# Patient Record
Sex: Male | Born: 1980 | Hispanic: Yes | Marital: Married | Smoking: Never smoker
Health system: Southern US, Community
[De-identification: ages and names within clinical notes are randomized; demographics above are authoritative.]

## PROBLEM LIST (undated history)

## (undated) DIAGNOSIS — K5790 Diverticulosis of intestine, part unspecified, without perforation or abscess without bleeding: Secondary | ICD-10-CM

## (undated) DIAGNOSIS — T7840XA Allergy, unspecified, initial encounter: Secondary | ICD-10-CM

## (undated) DIAGNOSIS — R7989 Other specified abnormal findings of blood chemistry: Secondary | ICD-10-CM

## (undated) HISTORY — PX: WISDOM TOOTH EXTRACTION: SHX21

## (undated) HISTORY — DX: Diverticulosis of intestine, part unspecified, without perforation or abscess without bleeding: K57.90

## (undated) HISTORY — DX: Other specified abnormal findings of blood chemistry: R79.89

## (undated) HISTORY — DX: Allergy, unspecified, initial encounter: T78.40XA

## (undated) HISTORY — PX: VASECTOMY: SHX75

---
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
## 2000-12-18 ENCOUNTER — Emergency Department (HOSPITAL_COMMUNITY): Admission: EM | Admit: 2000-12-18 | Discharge: 2000-12-18 | Payer: Self-pay | Admitting: Emergency Medicine

## 2001-12-14 ENCOUNTER — Emergency Department (HOSPITAL_COMMUNITY): Admission: EM | Admit: 2001-12-14 | Discharge: 2001-12-14 | Payer: Self-pay | Admitting: Emergency Medicine

## 2001-12-19 ENCOUNTER — Emergency Department (HOSPITAL_COMMUNITY): Admission: EM | Admit: 2001-12-19 | Discharge: 2001-12-19 | Payer: Self-pay | Admitting: Emergency Medicine

## 2011-05-25 ENCOUNTER — Ambulatory Visit (INDEPENDENT_AMBULATORY_CARE_PROVIDER_SITE_OTHER): Payer: BC Managed Care – PPO | Admitting: Family Medicine

## 2011-05-25 VITALS — BP 149/79 | HR 73 | Temp 98.4°F | Resp 16 | Ht 75.0 in | Wt 181.0 lb

## 2011-05-25 DIAGNOSIS — R5383 Other fatigue: Secondary | ICD-10-CM

## 2011-05-25 DIAGNOSIS — R5381 Other malaise: Secondary | ICD-10-CM

## 2011-05-25 LAB — COMPREHENSIVE METABOLIC PANEL
Albumin: 4.8 g/dL (ref 3.5–5.2)
CO2: 27 mEq/L (ref 19–32)
Calcium: 9.6 mg/dL (ref 8.4–10.5)
Chloride: 103 mEq/L (ref 96–112)
Glucose, Bld: 86 mg/dL (ref 70–99)
Potassium: 4.5 mEq/L (ref 3.5–5.3)
Sodium: 138 mEq/L (ref 135–145)
Total Bilirubin: 0.6 mg/dL (ref 0.3–1.2)
Total Protein: 7.4 g/dL (ref 6.0–8.3)

## 2011-05-25 LAB — CBC
Hemoglobin: 16.1 g/dL (ref 13.0–17.0)
MCH: 29.8 pg (ref 26.0–34.0)
RBC: 5.4 MIL/uL (ref 4.22–5.81)

## 2011-05-25 LAB — VITAMIN B12: Vitamin B-12: 865 pg/mL (ref 211–911)

## 2011-05-25 LAB — TSH: TSH: 1.604 u[IU]/mL (ref 0.350–4.500)

## 2011-05-25 NOTE — Progress Notes (Signed)
  Subjective:    Patient ID: Jesse Houston, male    DOB: 06-03-1980, 31 y.o.   MRN: [id number]  HPI 31 yo male with complaint of fatigue for 2 years.  Last few weeks seems worse - seems to have "mental fog".   3 years ago his 31 year old died of a brain tumor.  A year later his wife had a stillborn child (full term).  3 weeks ago his wife miscarried.  He feels this is related.  When his child was sick was up all day and night caring for him. Now endorses 7-8 hours of sleep a night.  Feels like he sleeps well but never feels fully rested.  Drowsy all day, no energy, does not enjoy doing things he used to enjoy.  Denies hopelessness, guilt, change of appetite.  Doesn't "feel depressed" but open to the possibility of that being the cause.   Does not fall asleep during the day but very tired all day.   Last labwork done here or in system January 2011 and normal except for borderline lipids.    Review of Systems Negative except as per HPI     Objective:   Physical Exam  Constitutional: He appears well-developed and well-nourished.  Cardiovascular: Normal rate, regular rhythm, normal heart sounds and intact distal pulses.   No murmur heard. Pulmonary/Chest: Effort normal and breath sounds normal.  Neurological: He is alert.  Skin: Skin is warm and dry.          Assessment & Plan:  Fatigue, lack of energy.  Suspect depression.  However will rule out organic causes by checking CBC, CMET, TSH, B12, Vit D, and testosterone.  If these are normal, patient open to trying antidepressant to see if it has any effect on symptoms.

## 2011-05-30 ENCOUNTER — Telehealth: Payer: Self-pay

## 2011-05-30 ENCOUNTER — Encounter: Payer: Self-pay | Admitting: Radiology

## 2011-05-30 NOTE — Telephone Encounter (Signed)
Yes, that is fine.  Just please find out the strength.  If he does not know it, okay to Rx Advair discus 100/5, 1 puff in  BID.  Number 1, refill x 3

## 2011-05-30 NOTE — Telephone Encounter (Signed)
Pt CB to get his lab results and gave current phone numbers. Explained lab results and called in Rx written by Dr Georgiana Shore in the lab notes to [facility] and [address]. Pt also requested a RF of his Advair diskus which he said we have Rxd in the past and he uses only during allergy season. I checked with his pharmacy and he doesn't have a Rx for inhaler that he has filled there. I have looked for pt's chart but could not find. I will ask [facility] to put it in your box if they can find it. Dr Georgiana Shore, do you want to send in a Rx for an Advair Diskus inhaler for pt? He did agree to RTC in 1 mos for re-check of testosterone, but is afraid he will need inhaler bf then d/t pollen season.

## 2011-06-03 MED ORDER — FLUTICASONE-SALMETEROL 250-50 MCG/DOSE IN AEPB: 1.0000 | INHALATION_SPRAY | Freq: Two times a day (BID) | RESPIRATORY_TRACT | Status: DC

## 2011-06-03 NOTE — Telephone Encounter (Signed)
Spoke with pt and he states he has been on the 250/50 inhaler. Sent in Advair 250/50 1 puff BID, #1 RF3. Pt also states that his pharmacy said his testosterone needs a PA and wonders if we can do this?

## 2011-06-04 NOTE — Telephone Encounter (Signed)
Called [employer] to get PA done and they said pt's coverage ended 05/16/11 and that he needs to call customer service to reactivate it. Notified pt and he will call them and/or talk with HR to find out who coverage is through, and he will CB.

## 2011-06-05 NOTE — Telephone Encounter (Signed)
LMOM that I received his message and completed it this morn, and that pt should check with his pharmacy tomorrow.

## 2011-06-05 NOTE — Telephone Encounter (Signed)
Pt states that he called [employer] and they stated that he is active and that they have no record of [place] contacting them. Pt would like for you to call back [employer] at [phone].

## 2011-08-02 ENCOUNTER — Ambulatory Visit (INDEPENDENT_AMBULATORY_CARE_PROVIDER_SITE_OTHER): Payer: BC Managed Care – PPO | Admitting: Internal Medicine

## 2011-08-02 VITALS — BP 100/64 | HR 56 | Temp 97.5°F | Resp 16 | Ht 75.0 in | Wt 178.0 lb

## 2011-08-02 DIAGNOSIS — R5381 Other malaise: Secondary | ICD-10-CM

## 2011-08-02 DIAGNOSIS — E291 Testicular hypofunction: Secondary | ICD-10-CM

## 2011-08-02 DIAGNOSIS — Z0000 Encounter for general adult medical examination without abnormal findings: Secondary | ICD-10-CM

## 2011-08-02 DIAGNOSIS — R5383 Other fatigue: Secondary | ICD-10-CM | POA: Insufficient documentation

## 2011-08-02 DIAGNOSIS — R7989 Other specified abnormal findings of blood chemistry: Secondary | ICD-10-CM | POA: Insufficient documentation

## 2011-08-02 LAB — POCT CBC
Granulocyte percent: 57 %G (ref 37–80)
Hemoglobin: 15 g/dL (ref 14.1–18.1)
MPV: 8.5 fL (ref 0–99.8)
POC Granulocyte: 4.8 (ref 2–6.9)
POC MID %: 8.5 %M (ref 0–12)
Platelet Count, POC: 300 10*3/uL (ref 142–424)
RBC: 4.14 M/uL — AB (ref 4.69–6.13)

## 2011-08-02 LAB — POCT URINALYSIS DIPSTICK
Bilirubin, UA: NEGATIVE
Glucose, UA: NEGATIVE
Leukocytes, UA: NEGATIVE
Nitrite, UA: NEGATIVE
Urobilinogen, UA: 0.2

## 2011-08-02 LAB — COMPREHENSIVE METABOLIC PANEL
ALT: 17 U/L (ref 0–53)
CO2: 27 mEq/L (ref 19–32)
Calcium: 9.2 mg/dL (ref 8.4–10.5)
Chloride: 103 mEq/L (ref 96–112)
Potassium: 4.2 mEq/L (ref 3.5–5.3)
Sodium: 139 mEq/L (ref 135–145)
Total Protein: 6.8 g/dL (ref 6.0–8.3)

## 2011-08-02 LAB — TSH: TSH: 1.134 u[IU]/mL (ref 0.350–4.500)

## 2011-08-02 LAB — PSA: PSA: 0.51 ng/mL (ref <=4.00)

## 2011-08-02 LAB — POCT UA - MICROSCOPIC ONLY: Casts, Ur, LPF, POC: NEGATIVE

## 2011-08-02 MED ORDER — FLUOXETINE HCL 20 MG PO CAPS: 20.0000 mg | ORAL_CAPSULE | Freq: Every day | ORAL | Status: DC

## 2011-08-02 NOTE — Patient Instructions (Signed)

## 2011-08-02 NOTE — Progress Notes (Signed)
  Subjective:    Patient ID: Jesse Houston, male    DOB: Oct 22, 1980, 31 y.o.   MRN: [id number]  HPI Hx of loss of child, and a stillborn child, and a misscarriage. Has fatigue and hx of low testosterone. Exercises and is fit. May be depressed, he agrees. Has good job.  See scanned hx See becks depression scale  Review of Systems See scanned ros    Objective:   Physical Exam  Constitutional: He appears well-developed and well-nourished. No distress.  HENT:  Right Ear: External ear normal.  Left Ear: External ear normal.  Nose: Nose normal.  Mouth/Throat: Oropharynx is clear and moist.  Eyes: Conjunctivae and EOM are normal. Pupils are equal, round, and reactive to light.  Neck: No thyromegaly present.  Cardiovascular: Normal rate, regular rhythm and normal heart sounds.   Pulmonary/Chest: Effort normal and breath sounds normal.  Abdominal: Soft. Bowel sounds are normal. He exhibits no mass.  Genitourinary: Penis normal. No penile tenderness.  Musculoskeletal: Normal range of motion. He exhibits no tenderness.  Lymphadenopathy:    He has no cervical adenopathy.  Neurological: He is alert. He has normal reflexes. Coordination normal.  Skin: Skin is warm and dry.  Psychiatric: He has a normal mood and affect. His behavior is normal. Thought content normal.   Labs done       Assessment & Plan:  Do early am total and free testosterone--- ordered

## 2011-08-03 ENCOUNTER — Encounter: Payer: Self-pay | Admitting: Radiology

## 2011-08-03 ENCOUNTER — Other Ambulatory Visit (INDEPENDENT_AMBULATORY_CARE_PROVIDER_SITE_OTHER): Payer: BC Managed Care – PPO | Admitting: Physician Assistant

## 2011-08-03 VITALS — BP 107/69 | HR 58 | Temp 98.3°F | Resp 16 | Ht 75.38 in | Wt 180.4 lb

## 2011-08-03 DIAGNOSIS — E236 Other disorders of pituitary gland: Secondary | ICD-10-CM

## 2011-08-03 DIAGNOSIS — R5381 Other malaise: Secondary | ICD-10-CM

## 2011-08-03 DIAGNOSIS — R7989 Other specified abnormal findings of blood chemistry: Secondary | ICD-10-CM

## 2011-08-03 NOTE — Progress Notes (Signed)
Patient here for total testosterone and free testosterone labs only. Order written on Rx by Dr. Perrin Maltese. Will forward results to Dr. Perrin Maltese.

## 2011-08-05 LAB — TESTOSTERONE, FREE, TOTAL, SHBG
Testosterone, Free: 76.3 pg/mL (ref 47.0–244.0)
Testosterone-% Free: 2.5 % (ref 1.6–2.9)
Testosterone: 299.76 ng/dL — ABNORMAL LOW (ref 300–890)

## 2012-07-31 ENCOUNTER — Ambulatory Visit (INDEPENDENT_AMBULATORY_CARE_PROVIDER_SITE_OTHER): Payer: BC Managed Care – PPO | Admitting: Family Medicine

## 2012-07-31 VITALS — BP 100/78 | HR 60 | Temp 98.0°F | Resp 16 | Ht 75.0 in | Wt 180.0 lb

## 2012-07-31 DIAGNOSIS — Z0000 Encounter for general adult medical examination without abnormal findings: Secondary | ICD-10-CM

## 2012-07-31 LAB — COMPREHENSIVE METABOLIC PANEL
BUN: 19 mg/dL (ref 6–23)
CO2: 28 mEq/L (ref 19–32)
Calcium: 9.5 mg/dL (ref 8.4–10.5)
Chloride: 103 mEq/L (ref 96–112)
Creat: 0.92 mg/dL (ref 0.50–1.35)

## 2012-07-31 LAB — POCT URINALYSIS DIPSTICK
Blood, UA: NEGATIVE
Ketones, UA: NEGATIVE
Protein, UA: NEGATIVE
Spec Grav, UA: 1.025
pH, UA: 6.5

## 2012-07-31 LAB — POCT CBC
Granulocyte percent: 64.5 %G (ref 37–80)
HCT, POC: 49.7 % (ref 43.5–53.7)
MCV: 91.8 fL (ref 80–97)
MID (cbc): 0.7 (ref 0–0.9)
POC Granulocyte: 5.3 (ref 2–6.9)
Platelet Count, POC: 373 10*3/uL (ref 142–424)
RBC: 5.41 M/uL (ref 4.69–6.13)

## 2012-07-31 LAB — TSH: TSH: 1.188 u[IU]/mL (ref 0.350–4.500)

## 2012-07-31 LAB — LIPID PANEL
HDL: 40 mg/dL (ref >39)
Triglycerides: 160 mg/dL — ABNORMAL HIGH (ref <150)

## 2012-07-31 NOTE — Patient Instructions (Addendum)
If problems arise return.

## 2012-07-31 NOTE — Progress Notes (Signed)
Annual physical examination:  History: Patient is here for physical examination. He just decided it was time he get one. He doesn't have any feels like he should have. He also has a sex drive he would like to have. He would like to have his testosterone checked. He has some progressive urination I Jesse Houston been no other major complaints.  Past medical history: General he is been healthy. Medications: Multivitamin Allergies: None Hospitalizations: None Operations: None  Family history: Both parents are living and well. One sibling living and well. No major familial diseases. The patient and his wife lost a 65-year-old son to a brain tumor a few years ago. The same year they had had a full-term stillborn.  Social history: Happily married to the same woman for 10 years. Has been together with her for 13 years. The above history on their son who died. Also had a miscarriage during the past year. Having children is on hold for now. Does not smoke, drinks on weekends, does not use any drugs. His job is Marketing executive business. He is currently working out the arm and travels one hour back and forth each day.  Review of systems: Constitutional: Doesn't have energy he feels like he should have. He does try to take care of himself. HEENT: Unremarkable Respiratory: Unremarkable Cardiovascular: Unremarkable Genitourinary: Unremarkable Gastrointestinal: Unremarkable Exoskeletal: Unremarkable Neurologic: Unremarkable Psychiatric: Unremarkable Dermatologic: Unremarkable Endocrinologic: Unremarkable  Physical examination Healthy-appearing male in no major acute distress. HEENT: Eyes PERRLA. Fundi benign. TMs normal. Throat clear. Neck supple without nodes thyromegaly. No carotid bruits. Chest clear to auscultation. Heart regular without murmurs gallops or arrhythmias. Abdomen soft without mass or tenderness. No lower extremity genitalia testes descended. No hernias. Testes are normal in size.  Extremities unremarkable. Spine normal. Skin normal. Deep tender reflexes symmetrical.  Assessment: Normal complete physical examination Fatigue  Plan: Screening labs  Initial labs including A1c, CBC, and urine are normal.

## 2012-08-03 ENCOUNTER — Encounter: Payer: Self-pay | Admitting: *Deleted

## 2012-11-27 ENCOUNTER — Ambulatory Visit (INDEPENDENT_AMBULATORY_CARE_PROVIDER_SITE_OTHER): Payer: BC Managed Care – PPO | Admitting: Family Medicine

## 2012-11-27 VITALS — BP 90/70 | HR 86 | Temp 98.5°F | Resp 16 | Ht 75.0 in | Wt 174.0 lb

## 2012-11-27 DIAGNOSIS — J309 Allergic rhinitis, unspecified: Secondary | ICD-10-CM

## 2012-11-27 DIAGNOSIS — J302 Other seasonal allergic rhinitis: Secondary | ICD-10-CM

## 2012-11-27 MED ORDER — FEXOFENADINE HCL 180 MG PO TABS
180.0000 mg | ORAL_TABLET | Freq: Every day | ORAL | Status: DC
Start: 2012-11-27 — End: 2013-07-05

## 2012-11-27 MED ORDER — FLUTICASONE PROPIONATE 50 MCG/ACT NA SUSP: 2.0000 | Freq: Every day | NASAL | Status: DC

## 2012-11-27 NOTE — Progress Notes (Signed)
 [facility]:  Office Visit  Chief Complaint:  Chief Complaint  Patient presents with  . Allergies    x 3 mths    HPI: Jesse Houston is a 32 y.o. male who complains of  Allergy sxs x 3 months, last week started on zyrtec and helps for 3-4 hrs. He just has congestion. He uses Advair diskus  for his asthma. He has been takinghis meds regular. He has stuffiness in his nose. He denies SOB, wheezing , tobacco use. Denies fevers, chills, sinus pressure  Past Medical History  Diagnosis Date  . Allergy   . Asthma   . Low testosterone    History reviewed. No pertinent past surgical history. History   Social History  . Marital Status: Married    Spouse Name: N/A    Number of Children: N/A  . Years of Education: N/A   Social History Main Topics  . Smoking status: Never Smoker   . Smokeless tobacco: None  . Alcohol Use: 1 - 1.5 oz/week    2-3 drink(s) per week  . Drug Use: No  . Sexual Activity: Yes    Birth Control/ Protection: Condom   Other Topics Concern  . None   Social History Narrative  . None   Family History  Problem Relation Age of Onset  . Arthritis Mother   . Brain cancer Son   . Arthritis Maternal Grandmother   . Diabetes Maternal Grandfather   . Uterine cancer Paternal Grandmother   . Cirrhosis Paternal Grandfather    No Known Allergies Prior to Admission medications   Medication Sig Start Date End Date Taking? Authorizing Provider  Multiple Vitamin (MULTIVITAMIN) tablet Take 1 tablet by mouth daily.   Yes Historical Provider, MD     ROS: The patient denies fevers, chills, night sweats, unintentional weight loss, chest pain, palpitations, wheezing, dyspnea on exertion, nausea, vomiting, abdominal pain, dysuria, hematuria, melena, numbness, weakness, or tingling.   All other systems have been reviewed and were otherwise negative with the exception of those mentioned in the HPI and as above.    PHYSICAL EXAM: Filed Vitals:   11/27/12 0819  BP: 90/70  Pulse: 86  Temp: 98.5 F (36.9 C)  Resp: 16   Filed Vitals:   11/27/12 0819  Height: 6\' 3"  (1.905 m)  Weight: 174 lb (78.926 kg)   Body mass index is 21.75 kg/(m^2).  General: Alert, no acute distress HEENT:  Normocephalic, atraumatic, oropharynx patent. EOMI, PERRLA. + boggy nares, erythematous.  No sinus tenderness, Tm nl Cardiovascular:  Regular rate and rhythm, no rubs murmurs or gallops.  No Carotid bruits, radial pulse intact. No pedal edema.  Respiratory: Clear to auscultation bilaterally.  No wheezes, rales, or rhonchi.  No cyanosis, no use of accessory musculature GI: No organomegaly, abdomen is soft and non-tender, positive bowel sounds.  No masses. Skin: No rashes. Neurologic: Facial musculature symmetric. Psychiatric: Patient is appropriate throughout our interaction. Lymphatic: No cervical lymphadenopathy Musculoskeletal: Gait intact.   LABS: Results for orders placed in visit on 07/31/12  COMPREHENSIVE METABOLIC PANEL      Result Value Range   Sodium 140  135 - 145 mEq/L   Potassium 4.2  3.5 - 5.3 mEq/L   Chloride 103  96 - 112 mEq/L   CO2 28  19 - 32 mEq/L   Glucose, Bld 86  70 - 99 mg/dL   BUN 19  6 - 23 mg/dL   Creat 1.61  0.96 - 0.45 mg/dL  Total Bilirubin 0.9  0.3 - 1.2 mg/dL   Alkaline Phosphatase 88  39 - 117 U/L   AST 19  0 - 37 U/L   ALT 16  0 - 53 U/L   Total Protein 7.0  6.0 - 8.3 g/dL   Albumin 4.8  3.5 - 5.2 g/dL   Calcium 9.5  8.4 - 16.1 mg/dL  TSH      Result Value Range   TSH 1.188  0.350 - 4.500 uIU/mL  LIPID PANEL      Result Value Range   Cholesterol 194  0 - 200 mg/dL   Triglycerides 096 (*) <150 mg/dL   HDL 40  >04 mg/dL   Total CHOL/HDL Ratio 4.9     VLDL 32  0 - 40 mg/dL   LDL Cholesterol 540 (*) 0 - 99 mg/dL  POCT CBC      Result Value Range   WBC 8.2  4.6 - 10.2 K/uL   Lymph, poc 2.2  0.6 - 3.4   POC LYMPH PERCENT 27.4  10 - 50 %L   MID (cbc) 0.7  0 - 0.9   POC MID % 8.1  0 - 12 %M   POC  Granulocyte 5.3  2 - 6.9   Granulocyte percent 64.5  37 - 80 %G   RBC 5.41  4.69 - 6.13 M/uL   Hemoglobin 16.6  14.1 - 18.1 g/dL   HCT, POC 98.1  19.1 - 53.7 %   MCV 91.8  80 - 97 fL   MCH, POC 30.7  27 - 31.2 pg   MCHC 33.4  31.8 - 35.4 g/dL   RDW, POC 47.8     Platelet Count, POC 373  142 - 424 K/uL   MPV 8.7  0 - 99.8 fL  POCT URINALYSIS DIPSTICK      Result Value Range   Color, UA yellow     Clarity, UA clear     Glucose, UA neg     Bilirubin, UA neg     Ketones, UA neg     Spec Grav, UA 1.025     Blood, UA neg     pH, UA 6.5     Protein, UA neg     Urobilinogen, UA 0.2     Nitrite, UA neg     Leukocytes, UA Negative    POCT GLYCOSYLATED HEMOGLOBIN (HGB A1C)      Result Value Range   Hemoglobin A1C 4.8       EKG/XRAY:   Primary read interpreted by Dr. Conley Rolls at [facility].   ASSESSMENT/PLAN: Encounter Diagnoses  Name Primary?  . Seasonal allergies Yes  . Allergic rhinitis    Rx Flonase  Rx Allegra May take Zyrtec in AM F/u prn Gross sideeffects, risk and benefits, and alternatives of medications d/w patient. Patient is aware that all medications have potential sideeffects and we are unable to predict every sideeffect or drug-drug interaction that may occur.  ,  PHUONG, DO 11/27/2012 8:48 AM

## 2012-11-27 NOTE — Patient Instructions (Signed)
Allergies, Generic  Allergies may happen from anything your body is sensitive to. This may be food, medicines, pollens, chemicals, and nearly anything around you in everyday life that produces allergens. An allergen is anything that causes an allergy producing substance. Heredity is often a factor in causing these problems. This means you may have some of the same allergies as your parents.  Food allergies happen in all age groups. Food allergies are some of the most severe and life threatening. Some common food allergies are cow's milk, seafood, eggs, nuts, wheat, and soybeans.  SYMPTOMS    Swelling around the mouth.   An itchy red rash or hives.   Vomiting or diarrhea.   Difficulty breathing.  SEVERE ALLERGIC REACTIONS ARE LIFE-THREATENING.  This reaction is called anaphylaxis. It can cause the mouth and throat to swell and cause difficulty with breathing and swallowing. In severe reactions only a trace amount of food (for example, peanut oil in a salad) may cause death within seconds.  Seasonal allergies occur in all age groups. These are seasonal because they usually occur during the same season every year. They may be a reaction to molds, grass pollens, or tree pollens. Other causes of problems are house dust mite allergens, pet dander, and mold spores. The symptoms often consist of nasal congestion, a runny itchy nose associated with sneezing, and tearing itchy eyes. There is often an associated itching of the mouth and ears. The problems happen when you come in contact with pollens and other allergens. Allergens are the particles in the air that the body reacts to with an allergic reaction. This causes you to release allergic antibodies. Through a chain of events, these eventually cause you to release histamine into the blood stream. Although it is meant to be protective to the body, it is this release that causes your discomfort. This is why you were given anti-histamines to feel better. If you are  unable to pinpoint the offending allergen, it may be determined by skin or blood testing. Allergies cannot be cured but can be controlled with medicine.  Hay fever is a collection of all or some of the seasonal allergy problems. It may often be treated with simple over-the-counter medicine such as diphenhydramine. Take medicine as directed. Do not drink alcohol or drive while taking this medicine. Check with your caregiver or package insert for child dosages.  If these medicines are not effective, there are many new medicines your caregiver can prescribe. Stronger medicine such as nasal spray, eye drops, and corticosteroids may be used if the first things you try do not work well. Other treatments such as immunotherapy or desensitizing injections can be used if all else fails. Follow up with your caregiver if problems continue. These seasonal allergies are usually not life threatening. They are generally more of a nuisance that can often be handled using medicine.  HOME CARE INSTRUCTIONS    If unsure what causes a reaction, keep a diary of foods eaten and symptoms that follow. Avoid foods that cause reactions.   If hives or rash are present:   Take medicine as directed.   You may use an over-the-counter antihistamine (diphenhydramine) for hives and itching as needed.   Apply cold compresses (cloths) to the skin or take baths in cool water. Avoid hot baths or showers. Heat will make a rash and itching worse.   If you are severely allergic:   Following a treatment for a severe reaction, hospitalization is often required for closer follow-up.     Wear a medic-alert bracelet or necklace stating the allergy.   You and your family must learn how to give adrenaline or use an anaphylaxis kit.   If you have had a severe reaction, always carry your anaphylaxis kit or EpiPen with you. Use this medicine as directed by your caregiver if a severe reaction is occurring. Failure to do so could have a fatal outcome.  SEEK  MEDICAL CARE IF:   You suspect a food allergy. Symptoms generally happen within 30 minutes of eating a food.   Your symptoms have not gone away within 2 days or are getting worse.   You develop new symptoms.   You want to retest yourself or your child with a food or drink you think causes an allergic reaction. Never do this if an anaphylactic reaction to that food or drink has happened before. Only do this under the care of a caregiver.  SEEK IMMEDIATE MEDICAL CARE IF:    You have difficulty breathing, are wheezing, or have a tight feeling in your chest or throat.   You have a swollen mouth, or you have hives, swelling, or itching all over your body.   You have had a severe reaction that has responded to your anaphylaxis kit or an EpiPen. These reactions may return when the medicine has worn off. These reactions should be considered life threatening.  MAKE SURE YOU:    Understand these instructions.   Will watch your condition.   Will get help right away if you are not doing well or get worse.  Document Released: 05/28/2002 Document Revised: 05/27/2011 Document Reviewed: 11/02/2007  [employer] Patient Information 2014 [employer].

## 2013-07-05 ENCOUNTER — Ambulatory Visit (INDEPENDENT_AMBULATORY_CARE_PROVIDER_SITE_OTHER): Payer: BC Managed Care – PPO | Admitting: Physician Assistant

## 2013-07-05 VITALS — BP 130/80 | HR 86 | Temp 98.0°F | Resp 16 | Ht 74.0 in | Wt 185.0 lb

## 2013-07-05 DIAGNOSIS — J309 Allergic rhinitis, unspecified: Secondary | ICD-10-CM

## 2013-07-05 DIAGNOSIS — R0602 Shortness of breath: Secondary | ICD-10-CM

## 2013-07-05 DIAGNOSIS — J302 Other seasonal allergic rhinitis: Secondary | ICD-10-CM

## 2013-07-05 MED ORDER — FLUTICASONE PROPIONATE 50 MCG/ACT NA SUSP: 2.0000 | Freq: Every day | NASAL | Status: DC

## 2013-07-05 MED ORDER — ALBUTEROL SULFATE HFA 108 (90 BASE) MCG/ACT IN AERS: 2.0000 | INHALATION_SPRAY | RESPIRATORY_TRACT | Status: DC | PRN

## 2013-07-05 MED ORDER — FLUTICASONE-SALMETEROL 115-21 MCG/ACT IN AERO: 2.0000 | INHALATION_SPRAY | Freq: Two times a day (BID) | RESPIRATORY_TRACT | Status: DC

## 2013-07-05 MED ORDER — FEXOFENADINE HCL 180 MG PO TABS: 180.0000 mg | ORAL_TABLET | Freq: Every day | ORAL | Status: DC

## 2013-07-05 NOTE — Progress Notes (Signed)
   Subjective:    Patient ID: Jesse Houston, male    DOB: 02/12/1981, 33 y.o.   MRN: [id number]  HPI 33 year old male presents for refills of his Advair inhaler and allergy meds.  He typically uses these in the spring due to seasonal allergies. Admits he does well the rest of the year.  2 days ago he developed SOB and chest tightness that worsened last night.  He has been using Flonase and Allegra which does keep his AR symptoms controlled.  No dx of asthma. Admits he had an albuterol inhaler that he used as a rescue but thinks he was told to dc it at some point. Does not know why - says it worked well for his symptoms.  Denies chest pain, wheezing, nasal congestion, PND, dizziness, headache, or cough.  Patient is otherwise doing well with no other concerns today.     Review of Systems  Constitutional: Negative for fever and chills.  HENT: Negative for congestion, postnasal drip and sore throat.   Respiratory: Positive for chest tightness and shortness of breath. Negative for cough and wheezing.   Cardiovascular: Negative for chest pain.  Neurological: Negative for dizziness and headaches.       Objective:   Physical Exam  Constitutional: He is oriented to person, place, and time. He appears well-developed and well-nourished.  HENT:  Head: Normocephalic and atraumatic.  Right Ear: External ear normal.  Left Ear: External ear normal.  Eyes: Conjunctivae are normal.  Neck: Normal range of motion. Neck supple.  Cardiovascular: Normal rate, regular rhythm and normal heart sounds.   Pulmonary/Chest: Effort normal and breath sounds normal.  Neurological: He is alert and oriented to person, place, and time.  Psychiatric: He has a normal mood and affect. His behavior is normal. Judgment and thought content normal.          Assessment & Plan:  Seasonal allergies - Plan: fexofenadine (ALLEGRA) 180 MG tablet, fluticasone (FLONASE) 50 MCG/ACT nasal spray, fluticasone-salmeterol (ADVAIR HFA)  115-21 MCG/ACT inhaler, albuterol (PROVENTIL HFA;VENTOLIN HFA) 108 (90 BASE) MCG/ACT inhaler  Allergic rhinitis - Plan: fexofenadine (ALLEGRA) 180 MG tablet, fluticasone (FLONASE) 50 MCG/ACT nasal spray  SOB (shortness of breath) - Plan: fluticasone-salmeterol (ADVAIR HFA) 115-21 MCG/ACT inhaler, albuterol (PROVENTIL HFA;VENTOLIN HFA) 108 (90 BASE) MCG/ACT inhaler  Refilled all medications and ok to refill if needed x 1 year.  Continue Allegra and Flonase daily as directed Advair twice daily and albuterol q4-6 hours prn SOB, chest tightness RTC precautions discussed. Follow up if symptoms worsening or fail to improve

## 2014-07-08 ENCOUNTER — Ambulatory Visit (INDEPENDENT_AMBULATORY_CARE_PROVIDER_SITE_OTHER): Payer: BLUE CROSS/BLUE SHIELD | Admitting: Family Medicine

## 2014-07-08 ENCOUNTER — Encounter: Payer: Self-pay | Admitting: Family Medicine

## 2014-07-08 VITALS — BP 121/81 | HR 71 | Temp 98.2°F | Resp 16 | Ht 75.0 in | Wt 162.6 lb

## 2014-07-08 DIAGNOSIS — Z131 Encounter for screening for diabetes mellitus: Secondary | ICD-10-CM | POA: Diagnosis not present

## 2014-07-08 DIAGNOSIS — Z0000 Encounter for general adult medical examination without abnormal findings: Secondary | ICD-10-CM

## 2014-07-08 DIAGNOSIS — R634 Abnormal weight loss: Secondary | ICD-10-CM

## 2014-07-08 DIAGNOSIS — R351 Nocturia: Secondary | ICD-10-CM | POA: Diagnosis not present

## 2014-07-08 DIAGNOSIS — Z130 Encounter for screening for diseases of the blood and blood-forming organs and certain disorders involving the immune mechanism: Secondary | ICD-10-CM | POA: Diagnosis not present

## 2014-07-08 DIAGNOSIS — Z13220 Encounter for screening for lipoid disorders: Secondary | ICD-10-CM | POA: Diagnosis not present

## 2014-07-08 LAB — CBC
HEMATOCRIT: 45.1 % (ref 39.0–52.0)
HEMOGLOBIN: 15.6 g/dL (ref 13.0–17.0)
MCH: 29.7 pg (ref 26.0–34.0)
MCHC: 34.6 g/dL (ref 30.0–36.0)
MCV: 85.7 fL (ref 78.0–100.0)
MPV: 9.5 fL (ref 8.6–12.4)
Platelets: 340 10*3/uL (ref 150–400)
RBC: 5.26 MIL/uL (ref 4.22–5.81)
RDW: 12.9 % (ref 11.5–15.5)
WBC: 8.5 10*3/uL (ref 4.0–10.5)

## 2014-07-08 LAB — LIPID PANEL
Cholesterol: 156 mg/dL (ref 0–200)
HDL: 42 mg/dL (ref >=40)
LDL CALC: 97 mg/dL (ref 0–99)
Total CHOL/HDL Ratio: 3.7 Ratio
Triglycerides: 83 mg/dL (ref <150)
VLDL: 17 mg/dL (ref 0–40)

## 2014-07-08 LAB — POCT UA - MICROSCOPIC ONLY
BACTERIA, U MICROSCOPIC: NEGATIVE
Casts, Ur, LPF, POC: NEGATIVE
Crystals, Ur, HPF, POC: NEGATIVE
EPITHELIAL CELLS, URINE PER MICROSCOPY: NEGATIVE
MUCUS UA: NEGATIVE
RBC, URINE, MICROSCOPIC: NEGATIVE
WBC, Ur, HPF, POC: NEGATIVE
Yeast, UA: NEGATIVE

## 2014-07-08 LAB — POCT URINALYSIS DIPSTICK
Bilirubin, UA: NEGATIVE
Blood, UA: NEGATIVE
Glucose, UA: NEGATIVE
Ketones, UA: NEGATIVE
Leukocytes, UA: NEGATIVE
Nitrite, UA: NEGATIVE
Protein, UA: NEGATIVE
SPEC GRAV UA: 1.02
Urobilinogen, UA: 0.2
pH, UA: 7

## 2014-07-08 LAB — TSH: TSH: 1.165 u[IU]/mL (ref 0.350–4.500)

## 2014-07-08 LAB — COMPREHENSIVE METABOLIC PANEL
ALT: 16 U/L (ref 0–53)
AST: 19 U/L (ref 0–37)
Albumin: 4.6 g/dL (ref 3.5–5.2)
Alkaline Phosphatase: 80 U/L (ref 39–117)
BUN: 14 mg/dL (ref 6–23)
CHLORIDE: 102 meq/L (ref 96–112)
CO2: 30 meq/L (ref 19–32)
CREATININE: 0.84 mg/dL (ref 0.50–1.35)
Calcium: 9.3 mg/dL (ref 8.4–10.5)
Glucose, Bld: 73 mg/dL (ref 70–99)
POTASSIUM: 4.5 meq/L (ref 3.5–5.3)
SODIUM: 137 meq/L (ref 135–145)
Total Bilirubin: 0.9 mg/dL (ref 0.2–1.2)
Total Protein: 7 g/dL (ref 6.0–8.3)

## 2014-07-08 MED ORDER — TAMSULOSIN HCL 0.4 MG PO CAPS: 0.4000 mg | ORAL_CAPSULE | Freq: Every day | ORAL | Status: DC

## 2014-07-08 NOTE — Progress Notes (Signed)
[facility] [address] [place] [address] [phone]  Date:  07/08/2014   Name:  Jesse Houston   DOB:  1980/06/10   MRN:  [id number]  PCP:  No PCP Per Patient    Chief Complaint: Annual Exam and Weight Loss   History of Present Illness:  Jesse Houston is a 34 y.o. very pleasant male patient who presents with the following:  Here today for a CPE. He has noted some weight loss. He has changed his diet; cut out carbs and bread, and he gets a lot of exercise at work.  He is not sure if this is why he has lost weight.   Ever since December he has noted burping at times. This seemed to start after he had a bout of food poisoning.  He also notes more burping at night.  He has not tried any acid reducers but did try some gas-x.  Last week he noted a vague abd pain but this is now resolved  He also notes that he tends to urinate at least twice a night. He has noted this for a couple of years or more.  He tries to fluid restrict in the evening.  He does not have frequent urination in the am.  He does not have any pain with urination, no blood in his urine.    He is not sure of the date of his last tetanus shot but notes it was less than 10 years ago  He is married, no a smoker, drinks alcohol rarely  Wt Readings from Last 3 Encounters:  07/08/14 162 lb 9.6 oz (73.755 kg)  07/05/13 185 lb (83.915 kg)  11/27/12 174 lb (78.926 kg)     Patient Active Problem List   Diagnosis Date Noted  . Fatigue 08/02/2011  . Low testosterone 08/02/2011    Past Medical History  Diagnosis Date  . Allergy   . Asthma   . Low testosterone     History reviewed. No pertinent past surgical history.  History  Substance Use Topics  . Smoking status: Never Smoker   . Smokeless tobacco: Not on file  . Alcohol Use: 1.0 - 1.5 oz/week    2-3 Standard drinks or equivalent per week    Family History  Problem Relation Age of Onset  . Arthritis Mother   . Brain cancer Son   .  Arthritis Maternal Grandmother   . Diabetes Maternal Grandfather   . Uterine cancer Paternal Grandmother   . Cirrhosis Paternal Grandfather     No Known Allergies  Medication list has been reviewed and updated.  Current Outpatient Prescriptions on File Prior to Visit  Medication Sig Dispense Refill  . fexofenadine (ALLEGRA) 180 MG tablet Take 1 tablet (180 mg total) by mouth daily. 30 tablet 5  . fluticasone (FLONASE) 50 MCG/ACT nasal spray Place 2 sprays into both nostrils daily. 16 g 5  . Multiple Vitamin (MULTIVITAMIN) tablet Take 1 tablet by mouth daily.    Marland Kitchen albuterol (PROVENTIL HFA;VENTOLIN HFA) 108 (90 BASE) MCG/ACT inhaler Inhale 2 puffs into the lungs every 4 (four) hours as needed for wheezing or shortness of breath (cough, shortness of breath or wheezing.). (Patient not taking: Reported on 07/08/2014) 1 Inhaler 3  . fluticasone-salmeterol (ADVAIR HFA) 115-21 MCG/ACT inhaler Inhale 2 puffs into the lungs 2 (two) times daily. (Patient not taking: Reported on 07/08/2014) 1 Inhaler 5   No current facility-administered medications on file prior to visit.    Review of  Systems:  As per HPI- otherwise negative.   Physical Examination: Filed Vitals:   07/08/14 0825  BP: 94/64  Pulse: 73  Temp: 98.2 F (36.8 C)  Resp: 16   Filed Vitals:   07/08/14 0825  Height: 6\' 3"  (1.905 m)  Weight: 162 lb 9.6 oz (73.755 kg)   Body mass index is 20.32 kg/(m^2). Ideal Body Weight: Weight in (lb) to have BMI = 25: 199.6  GEN: WDWN, NAD, Non-toxic, A & O x 3, looks well, slim build and tall HEENT: Atraumatic, Normocephalic. Neck supple. No masses, No LAD. Ears and Nose: No external deformity. CV: RRR, No M/G/R. No JVD. No thrill. No extra heart sounds. PULM: CTA B, no wheezes, crackles, rhonchi. No retractions. No resp. distress. No accessory muscle use. ABD: S, NT, ND, +BS. No rebound. No HSM. EXTR: No c/c/e NEURO Normal gait.  PSYCH: Normally interactive. Conversant. Not depressed  or anxious appearing.  Calm demeanor. Normal penis and testicles. Prostate is smooth, but slightly larger than I would expect for age   BP Readings from Last 3 Encounters:  07/08/14 94/64  07/05/13 130/80  11/27/12 90/70   Results for orders placed or performed in visit on 07/08/14  POCT UA - Microscopic Only  Result Value Ref Range   WBC, Ur, HPF, POC neg    RBC, urine, microscopic neg    Bacteria, U Microscopic neg    Mucus, UA neg    Epithelial cells, urine per micros neg    Crystals, Ur, HPF, POC neg    Casts, Ur, LPF, POC neg    Yeast, UA neg   POCT urinalysis dipstick  Result Value Ref Range   Color, UA yellow    Clarity, UA clear    Glucose, UA neg    Bilirubin, UA neg    Ketones, UA neg    Spec Grav, UA 1.020    Blood, UA neg    pH, UA 7.0    Protein, UA neg    Urobilinogen, UA 0.2    Nitrite, UA neg    Leukocytes, UA Negative     Assessment and Plan: Physical exam  Weight loss, abnormal - Plan: TSH  Nocturia - Plan: POCT UA - Microscopic Only, POCT urinalysis dipstick, Urine culture, tamsulosin (FLOMAX) 0.4 MG CAPS capsule  Screening for hyperlipidemia - Plan: Lipid panel  Screening for diabetes mellitus - Plan: Comprehensive metabolic panel  Screening for deficiency anemia - Plan: CBC he would like to try flomax to see if this will help with urinary frequency at night.  He will let me know if not helpful Otherwise await labs and will follow-up with him Reassured that his current weight is ok, but if he wishes to gain weight again I would stop restricting his diet and he will likely gain  Will plan further follow- up pending labs.    Signed Abbe AmsterdamJessica Daksha Koone, MD

## 2014-07-08 NOTE — Patient Instructions (Addendum)
I will be touch with the rest of your labs If you are concerned about your weight I would recommend that you add carbohydrates back to your diet.  For your stomach, try using 2 weeks of OTC zantac for gastritis We will try flomax for your urinary frequency.  Take it once a day and see if this will help with your night-time urination

## 2014-07-10 LAB — URINE CULTURE
Colony Count: NO GROWTH
Organism ID, Bacteria: NO GROWTH

## 2016-02-29 ENCOUNTER — Ambulatory Visit (INDEPENDENT_AMBULATORY_CARE_PROVIDER_SITE_OTHER): Payer: BLUE CROSS/BLUE SHIELD | Admitting: Family Medicine

## 2016-02-29 ENCOUNTER — Encounter (HOSPITAL_COMMUNITY): Payer: Self-pay

## 2016-02-29 ENCOUNTER — Emergency Department (HOSPITAL_COMMUNITY)
Admission: EM | Admit: 2016-02-29 | Discharge: 2016-03-01 | Disposition: A | Discharge status: Home / Self Care | Payer: BLUE CROSS/BLUE SHIELD | Attending: Emergency Medicine | Admitting: Emergency Medicine

## 2016-02-29 ENCOUNTER — Emergency Department (HOSPITAL_COMMUNITY): Payer: BLUE CROSS/BLUE SHIELD

## 2016-02-29 VITALS — BP 138/96 | HR 104 | Temp 98.5°F | Resp 17 | Ht 75.0 in | Wt 171.0 lb

## 2016-02-29 DIAGNOSIS — J029 Acute pharyngitis, unspecified: Secondary | ICD-10-CM | POA: Insufficient documentation

## 2016-02-29 DIAGNOSIS — J36 Peritonsillar abscess: Secondary | ICD-10-CM | POA: Diagnosis not present

## 2016-02-29 DIAGNOSIS — J45909 Unspecified asthma, uncomplicated: Secondary | ICD-10-CM | POA: Insufficient documentation

## 2016-02-29 LAB — I-STAT CHEM 8, ED
BUN: 22 mg/dL — ABNORMAL HIGH (ref 6–20)
CALCIUM ION: 1.1 mmol/L — AB (ref 1.15–1.40)
Chloride: 102 mmol/L (ref 101–111)
Creatinine, Ser: 1 mg/dL (ref 0.61–1.24)
GLUCOSE: 103 mg/dL — AB (ref 65–99)
HCT: 37 % — ABNORMAL LOW (ref 39.0–52.0)
HEMOGLOBIN: 12.6 g/dL — AB (ref 13.0–17.0)
Potassium: 3.9 mmol/L (ref 3.5–5.1)
Sodium: 137 mmol/L (ref 135–145)
TCO2: 22 mmol/L (ref 0–100)

## 2016-02-29 LAB — CBC WITH DIFFERENTIAL/PLATELET
BASOS PCT: 0 %
Basophils Absolute: 0 10*3/uL (ref 0.0–0.1)
EOS PCT: 0 %
Eosinophils Absolute: 0 10*3/uL (ref 0.0–0.7)
HCT: 37.5 % — ABNORMAL LOW (ref 39.0–52.0)
Hemoglobin: 13 g/dL (ref 13.0–17.0)
LYMPHS ABS: 2.3 10*3/uL (ref 0.7–4.0)
Lymphocytes Relative: 11 %
MCH: 29.7 pg (ref 26.0–34.0)
MCHC: 34.7 g/dL (ref 30.0–36.0)
MCV: 85.6 fL (ref 78.0–100.0)
MONO ABS: 1.9 10*3/uL — AB (ref 0.1–1.0)
Monocytes Relative: 9 %
Neutro Abs: 16.7 10*3/uL — ABNORMAL HIGH (ref 1.7–7.7)
Neutrophils Relative %: 80 %
PLATELETS: 314 10*3/uL (ref 150–400)
RBC: 4.38 MIL/uL (ref 4.22–5.81)
RDW: 12.4 % (ref 11.5–15.5)
WBC: 20.9 10*3/uL — AB (ref 4.0–10.5)

## 2016-02-29 MED ORDER — KETOROLAC TROMETHAMINE 60 MG/2ML IM SOLN
60.0000 mg | Freq: Once | INTRAMUSCULAR | Status: AC
  Administered 2016-02-29: 60 mg via INTRAMUSCULAR

## 2016-02-29 MED ORDER — DEXAMETHASONE SODIUM PHOSPHATE 10 MG/ML IJ SOLN
10.0000 mg | Freq: Once | INTRAMUSCULAR | Status: AC
  Administered 2016-02-29: 10 mg via INTRAVENOUS
  Filled 2016-02-29: qty 1

## 2016-02-29 MED ORDER — SODIUM CHLORIDE 0.9 % IV BOLUS (SEPSIS)
1000.0000 mL | Freq: Once | INTRAVENOUS | Status: AC
  Administered 2016-02-29: 1000 mL via INTRAVENOUS

## 2016-02-29 MED ORDER — MORPHINE SULFATE (PF) 4 MG/ML IV SOLN
4.0000 mg | Freq: Once | INTRAVENOUS | Status: AC
  Administered 2016-02-29: 4 mg via INTRAVENOUS
  Filled 2016-02-29: qty 1

## 2016-02-29 MED ORDER — GI COCKTAIL ~~LOC~~
30.0000 mL | Freq: Once | ORAL | Status: AC
  Administered 2016-02-29: 30 mL via ORAL
  Filled 2016-02-29: qty 30

## 2016-02-29 MED ORDER — IOPAMIDOL (ISOVUE-300) INJECTION 61%
INTRAVENOUS | Status: AC
  Filled 2016-02-29: qty 75

## 2016-02-29 MED ORDER — IOPAMIDOL (ISOVUE-300) INJECTION 61%
INTRAVENOUS | Status: AC
  Administered 2016-02-29: 75 mL
  Filled 2016-02-29: qty 75

## 2016-02-29 MED ORDER — ONDANSETRON HCL 4 MG/2ML IJ SOLN
4.0000 mg | Freq: Once | INTRAMUSCULAR | Status: AC
  Administered 2016-02-29: 4 mg via INTRAVENOUS
  Filled 2016-02-29: qty 2

## 2016-02-29 MED ORDER — HYDROCODONE-ACETAMINOPHEN 7.5-325 MG/15ML PO SOLN: 15.0000 mL | Freq: Four times a day (QID) | ORAL | 0 refills | Status: AC | PRN

## 2016-02-29 MED ORDER — CLINDAMYCIN PHOSPHATE 600 MG/50ML IV SOLN
600.0000 mg | Freq: Once | INTRAVENOUS | Status: AC
  Administered 2016-02-29: 600 mg via INTRAVENOUS
  Filled 2016-02-29: qty 50

## 2016-02-29 MED ORDER — CLINDAMYCIN HCL 150 MG PO CAPS: 300.0000 mg | ORAL_CAPSULE | Freq: Four times a day (QID) | ORAL | 0 refills | Status: DC

## 2016-02-29 NOTE — ED Notes (Signed)
Patient states he is feeling slightly better and is ready to go home but needs something else for pain - Dr Anitra LauthPlunkett aware

## 2016-02-29 NOTE — ED Triage Notes (Signed)
Pt sent here by [facility] for peritonsillar abscess. Unable to get a good image but airway intact. He reports pain in the left side of his neck and his ear. Pt controlling secretions. Sent here for IV abx.

## 2016-02-29 NOTE — ED Provider Notes (Signed)
[facility] Provider Note   CSN: [id number] Arrival date & time: 02/29/16  1743     History   Chief Complaint Chief Complaint  Patient presents with  . Sore Throat    HPI Jesse Houston is a 35 y.o. male.  Patient is a 35 year old healthy male with a history of asthma presenting today with worsening sore throat. He states he's had a sore throat for the last 4-5 days but is becoming extreme to the point where he was waking up last night because he was unable to swallow. He has been able to tolerate some water today but went to urgent care because the pain was getting so bad. He describes it as a pain in the right side of his throat that radiates into his right ear. It is sharp and 10 out of 10 in nature. He denies any shortness of breath or respiratory complaints. He does note about one week ago he had URI symptoms with a cough and congestion but that resolved just prior to the pain starting.  He has had chills but denies any fever. He has had minimal by mouth intake in the last few days   The history is provided by the patient.  Sore Throat  This is a new problem. Episode onset: 4-5 days ago.    Past Medical History:  Diagnosis Date  . Allergy   . Asthma   . Low testosterone     Patient Active Problem List   Diagnosis Date Noted  . Fatigue 08/02/2011  . Low testosterone 08/02/2011    History reviewed. No pertinent surgical history.     Home Medications    Prior to Admission medications   Medication Sig Start Date End Date Taking? Authorizing Provider  Multiple Vitamin (MULTIVITAMIN) tablet Take 1 tablet by mouth daily.    Historical Provider, MD  Omega-3 Fatty Acids (FISH OIL) 1000 MG CAPS Take by mouth.    Historical Provider, MD    Family History Family History  Problem Relation Age of Onset  . Arthritis Mother   . Brain cancer Son   . Arthritis Maternal Grandmother   . Diabetes Maternal Grandfather   . Uterine cancer Paternal Grandmother   .  Cirrhosis Paternal Grandfather     Social History Social History  Substance Use Topics  . Smoking status: Never Smoker  . Smokeless tobacco: Never Used  . Alcohol use 1.0 - 1.5 oz/week    2 - 3 Standard drinks or equivalent per week     Allergies   Patient has no known allergies.   Review of Systems Review of Systems  All other systems reviewed and are negative.    Physical Exam Updated Vital Signs BP 118/78 (BP Location: Left Arm)   Pulse 111   Temp 98.6 F (37 C) (Oral)   Resp 20   SpO2 98%   Physical Exam  Constitutional: He is oriented to person, place, and time. He appears well-developed and well-nourished. No distress.  HENT:  Head: Normocephalic and atraumatic.  Mouth/Throat: Mucous membranes are dry. Oropharyngeal exudate and posterior oropharyngeal erythema present. Tonsils are 2+ on the right. Tonsils are 1+ on the left. Tonsillar exudate.    Eyes: Conjunctivae and EOM are normal. Pupils are equal, round, and reactive to light.  Neck: Normal range of motion. Neck supple.  Cardiovascular: Regular rhythm and intact distal pulses.  Tachycardia present.   No murmur heard. Pulmonary/Chest: Effort normal and breath sounds normal. No respiratory distress. He has no wheezes. He  has no rales.  Abdominal: Soft. He exhibits no distension. There is no tenderness. There is no rebound and no guarding.  Musculoskeletal: Normal range of motion. He exhibits no edema or tenderness.  Lymphadenopathy:    He has cervical adenopathy.  Neurological: He is alert and oriented to person, place, and time.  Skin: Skin is warm and dry. No rash not295[facility] Di106sAdvNielGerilyn Pi here is adenoid, palatine and lingual tonsil hypertrophy. The degree of hypertrophy is greatest in the the palatine tonsils. Within the right palatine tonsil, there is central low attenuation, with density measuring slightly greater than fluid. The airway remains widely patent. The appearance of the larynx is normal. The epiglottis is normal. No retropharyngeal effusion or other collection. Salivary glands: The parotid and submandibular glands are normal. No sialolithiasis or salivary ductal dilatation. Thyroid: Normal Lymph nodes: There are numerous bilateral enlarged lymph nodes, measuring up to 1.6 cm a right level IIa and 1.2 cm the left level IIa. Vascular: Major cervical vessels are patent. Limited intracranial: Normal Visualized orbits: Normal Mastoids and visualized paranasal sinuses: Retention cysts within both maxillary sinuses. Skeleton: Normal Upper chest: Clear Other: None IMPRESSION: 1. Diffuse edematous enlargement of the palatine, lingual and adenoid tonsils, consistent with acute pharyngitis. 2. Focal area of low attenuation within the right palatine tonsil is most consistent with  phlegmonous material and may indicate early peritonsillar abscess formation; but there is no well-defined abscess at this time. 3. Bilateral reactive cervical lymphadenopathy. 4. No airway compromise. Electronically Signed   By: Kevin  Herman M.D.   On: 02/29/2016 21:36    Procedures Procedures (including critical care time)  Medications Ordered in ED Medications  ondansetron (ZOFRAN) injection 4 mg (not administered)  morphine 4 MG/ML injection 4 mg (not administered)  sodium chloride 0.9 % bolus 1,000 mL (not administered)  clindamycin (CLEOCIN) IVPB 600 mg (not administered)     Initial Impression / Assessment and Plan / ED Course  I have reviewed the triage vital signs and the nursing notes.  Pertinent labs & imaging results that were available during my care of the patient were reviewed by me and considered in my medical decision making (see chart for details).  Clinical  Course     Patient presenting with worsening throat pain over the last 3-4 days. He was able to tolerate some water today but was having difficulty tolerating oral secretions with lying down last night. He had URI symptoms a week earlier but those have resolved and now his only complaint is sore throat. He was seen in urgent care and sent here. Concern for potential periTonsillar abscess versus bad pharyngitis. Low suspicion for epiglottitis or retropharyngeal abscess at this time. Patient does appear dehydrated. Was given IV fluids, pain control, clindamycin and CT to rule out PTA pending  11:37 PM White blood cell count of 20,000 with otherwise normal labs. CT showing a focal area of low attenuation within the right palatine tonsil itches most consistent with phlegmatious material without well-defined abscess at this time. Discussed with Dr. Ezzard StandingNewman and patient will continue on clindamycin and follow-up in the office is worsening. Patient was given Decadron. He was sent home with Lortab elixir. Final Clinical  Impressions(s) / ED Diagnoses   Final diagnoses:  Pharyngitis, unspecified etiology    New Prescriptions New Prescriptions   CLINDAMYCIN (CLEOCIN) 150 MG CAPSULE    Take 2 capsules (300 mg total) by mouth every 6 (six) hours.   HYDROCODONE-ACETAMINOPHEN (HYCET) 7.5-325 MG/15 ML SOLUTION    Take 15 mLs by mouth 4 (four) times daily as needed for moderate pain.     Gwyneth SproutWhitney Epimenio Schetter, MD 02/29/16 [phone]

## 2016-02-29 NOTE — Progress Notes (Addendum)
Jesse Houston is a 35 y.o. male who [facility] today for sore throat:    1.  Sore throat:  Present for the past 4 days. This is been increasing in intensity. It is unilateral. He recently had what sounds to be viral URI symptoms for the past 2 weeks. He started getting over this but then the sore throat started on Monday. He is having difficulty eating any solid food secondary to the degree of pain. He's had no trouble the airway or breathing. He is trying to drink fluids but it does cause in his words "excruciating pain." He's had subjective fevers and chills with sweating at home. He has not taken his temperature.   He has no cough, chest pain, dyspnea, nausea/vomiting/abdominal pain.  He is hungry but afraid to eat due to the pain.  His throat is tender to touch.   ROS as above.   PMH reviewed. Patient is a nonsmoker.   Past Medical History:  Diagnosis Date  . Allergy   . Asthma   . Low testosterone    No past surgical history on file.  Medications reviewed. Current Outpatient Prescriptions  Medication Sig Dispense Refill  . Multiple Vitamin (MULTIVITAMIN) tablet Take 1 tablet by mouth daily.    . Omega-3 Fatty Acids (FISH OIL) 1000 MG CAPS Take by mouth.     No current facility-administered medications for this visit.      Physical Exam:  BP (!) 138/96 (BP Location: Right Arm, Patient Position: Sitting, Cuff Size: Normal)   Pulse (!) 104   Temp 98.5 F (36.9 C) (Oral)   Resp 17   Ht 6\' 3"  (1.905 m)   Wt 171 lb (77.6 kg)   SpO2 100%   BMI 21.37 kg/m  Gen:  Patient sitting on exam table, appears stated age.  No real distress though he does appear ill. He speaks with a muffled voice. Head: Normocephalic atraumatic Eyes: EOMI, PERRL, sclera and conjunctiva non-erythematous Ears:  Canals clear bilaterally.  TMs pearly gray bilaterally without erythema or bulging.   Nose: Patent without exudates.  Mouth: Mucosa membranes moist. He has  swelling of the right tonsil and peritonsillar area which is causing a shift in his uvula to the left. Demonstrates trismus on examination. Begins gagging when trying to open his mouth wide. He also does have notable exudates on the right swollen tonsil. Neck: Palpable fluctuant mass about 5 cm in diameter right throat. This is tender to touch. Heart:  RRR, no murmurs auscultated. Pulm:  Clear to auscultation bilaterally with good air movement.  No wheezes or rales noted.     Assessment and Plan:  1.  Peritonsillar abscess: -This is most likely diagnosis and obviously one of the more worrying diagnoses. -He does have muffled voice. He is having odynophagia and dysphagia with solids. He has marked swelling Right tonsil and deviated uvula. This is progressively worsening such that he now has odynophagia even with trying to drink water. The only thing he has tried to drink today is water. -He is not yet to the stage of drooling. His airway is patent. -This is been going on since Monday. He drove himself here. We're therefore going to send him to the emergency department by private vehicle. He will need urgent evaluation and likely IV antibiotics, imaging, possibility of incision and drainage.  Explained all this to him and understands the gravity of the situation. He is going to drive straight to the emergency room. -  We did give him 60 mg of Toradol here before he left as he is asking for some pain relief due to degree of pain.  - I called and spoke with the Charge nurse and relayed the patient's name and birth date.

## 2016-02-29 NOTE — ED Notes (Signed)
Pt returned from CT °

## 2016-02-29 NOTE — Patient Instructions (Addendum)
You have a peritonsillar abscess.  The only real treatment for this is IV antibiotics. You may receive some imaging in the ER which would determine whether you need surgery.  I'm going to call the emergency department to tell them you are coming.  [facility]   Peritonsillar Abscess Introduction A peritonsillar abscess is a collection of yellowish-white fluid (pus) in the back of the throat. It forms behind the tonsils. Treatment usually involves draining the fluid. This may be done by:  Putting a needle into the abscess.  Cutting and draining the abscess. Follow these instructions at home:  Rest as much as you can.  Take medicines only as told by your doctor.  If you were given an antibiotic medicine, finish it all even if you start to feel better.  If your abscess was drained by your doctor:  Mix 1 teaspoon of salt in 8 ounces of warm water for gargling.  Gargle 4 times per day or as needed for comfort.  Do not swallow this mixture.  Drink a lot of fluids.  Eat soft or liquid foods while your throat is sore. Frozen ice pops and ice cream are good choices.  Keep all follow-up visits as told by your doctor. This is important. Contact a doctor if:  You have more pain, swelling, redness, or drainage in your throat.  You have a headache, have low energy, or feel sick.  You have a fever.  You feel dizzy.  You have trouble swallowing or eating.  You have signs of body fluid loss (dehydration):  Light-headedness when you are standing.  Peeing (urinating) less.  A fast heart rate.  Dry mouth. Get help right away if:  You have trouble talking or breathing.  You find it easier to breathe when you lean forward.  You are coughing up blood or throwing up (vomiting) blood.  You have severe throat pain that is not helped by medicines.  You start to drool. This information is not intended to replace advice given to you by your health care  provider. Make sure you discuss any questions you have with your health care provider. Document Released: 02/20/2009 Document Revised: 08/10/2015 Document Reviewed: 10/18/2013  2017 Elsevier     IF you received an x-ray today, you will receive an invoice from [facility]. Please contact [facility] at [phone] with questions or concerns regarding your invoice.   IF you received labwork today, you will receive an invoice from [facility]. Please contact [facility] at [phone] with questions or concerns regarding your invoice.   Our billing staff will not be able to assist you with questions regarding bills from these companies.  You will be contacted with the lab results as soon as they are available. The fastest way to get your results is to activate your My Chart account. Instructions are located on the last page of this paperwork. If you have not heard from us regarding the results in 2 weeks, please contact this office.

## 2016-02-29 NOTE — ED Notes (Signed)
Pt transported to CT ?

## 2016-02-29 NOTE — ED Notes (Signed)
EDP at bedside  

## 2017-01-26 DIAGNOSIS — H5213 Myopia, bilateral: Secondary | ICD-10-CM | POA: Diagnosis not present

## 2017-01-30 DIAGNOSIS — H40009 Preglaucoma, unspecified, unspecified eye: Secondary | ICD-10-CM | POA: Diagnosis not present

## 2017-04-18 ENCOUNTER — Encounter: Payer: Self-pay | Admitting: Emergency Medicine

## 2017-04-18 ENCOUNTER — Other Ambulatory Visit: Payer: Self-pay

## 2017-04-18 ENCOUNTER — Ambulatory Visit (INDEPENDENT_AMBULATORY_CARE_PROVIDER_SITE_OTHER): Payer: BLUE CROSS/BLUE SHIELD | Admitting: Emergency Medicine

## 2017-04-18 VITALS — BP 110/58 | HR 58 | Temp 97.4°F | Resp 16 | Ht 75.0 in | Wt 179.4 lb

## 2017-04-18 DIAGNOSIS — N529 Male erectile dysfunction, unspecified: Secondary | ICD-10-CM | POA: Diagnosis not present

## 2017-04-18 DIAGNOSIS — Z0000 Encounter for general adult medical examination without abnormal findings: Secondary | ICD-10-CM

## 2017-04-18 MED ORDER — SILDENAFIL CITRATE 100 MG PO TABS: 50.0000 mg | ORAL_TABLET | Freq: Every day | ORAL | 11 refills | Status: DC | PRN

## 2017-04-18 NOTE — Patient Instructions (Addendum)
   IF you received an x-ray today, you will receive an invoice from [facility]. Please contact [facility] at [phone] with questions or concerns regarding your invoice.   IF you received labwork today, you will receive an invoice from [facility]. Please contact [facility] at [phone] with questions or concerns regarding your invoice.   Our billing staff will not be able to assist you with questions regarding bills from these companies.  You will be contacted with the lab results as soon as they are available. The fastest way to get your results is to activate your My Chart account. Instructions are located on the last page of this paperwork. If you have not heard from us regarding the results in 2 weeks, please contact this office.      Health Maintenance, Male A healthy lifestyle and preventive care is important for your health and wellness. Ask your health care provider about what schedule of regular examinations is right for you. What should I know about weight and diet? Eat a Healthy Diet  Eat plenty of vegetables, fruits, whole grains, low-fat dairy products, and lean protein.  Do not eat a lot of foods high in solid fats, added sugars, or salt.  Maintain a Healthy Weight Regular exercise can help you achieve or maintain a healthy weight. You should:  Do at least 150 minutes of exercise each week. The exercise should increase your heart rate and make you sweat (moderate-intensity exercise).  Do strength-training exercises at least twice a week.  Watch Your Levels of Cholesterol and Blood Lipids  Have your blood tested for lipids and cholesterol every 5 years starting at 37 years of age. If you are at high risk for heart disease, you should start having your blood tested when you are 37 years old. You may need to have your cholesterol levels checked more often if: ? Your lipid or cholesterol levels are high. ? You are older than 37 years of age. ? You  are at high risk for heart disease.  What should I know about cancer screening? Many types of cancers can be detected early and may often be prevented. Lung Cancer  You should be screened every year for lung cancer if: ? You are a current smoker who has smoked for at least 30 years. ? You are a former smoker who has quit within the past 15 years.  Talk to your health care provider about your screening options, when you should start screening, and how often you should be screened.  Colorectal Cancer  Routine colorectal cancer screening usually begins at 37 years of age and should be repeated every 5-10 years until you are 37 years old. You may need to be screened more often if early forms of precancerous polyps or small growths are found. Your health care provider may recommend screening at an earlier age if you have risk factors for colon cancer.  Your health care provider may recommend using home test kits to check for hidden blood in the stool.  A small camera at the end of a tube can be used to examine your colon (sigmoidoscopy or colonoscopy). This checks for the earliest forms of colorectal cancer.  Prostate and Testicular Cancer  Depending on your age and overall health, your health care provider may do certain tests to screen for prostate and testicular cancer.  Talk to your health care provider about any symptoms or concerns you have about testicular or prostate cancer.  Skin Cancer  Check your skin   from head to toe regularly.  Tell your health care provider about any new moles or changes in moles, especially if: ? There is a change in a mole's size, shape, or color. ? You have a mole that is larger than a pencil eraser.  Always use sunscreen. Apply sunscreen liberally and repeat throughout the day.  Protect yourself by wearing long sleeves, pants, a wide-brimmed hat, and sunglasses when outside.  What should I know about heart disease, diabetes, and high blood  pressure?  If you are 18-39 years of age, have your blood pressure checked every 3-5 years. If you are 40 years of age or older, have your blood pressure checked every year. You should have your blood pressure measured twice-once when you are at a hospital or clinic, and once when you are not at a hospital or clinic. Record the average of the two measurements. To check your blood pressure when you are not at a hospital or clinic, you can use: ? An automated blood pressure machine at a pharmacy. ? A home blood pressure monitor.  Talk to your health care provider about your target blood pressure.  If you are between 45-79 years old, ask your health care provider if you should take aspirin to prevent heart disease.  Have regular diabetes screenings by checking your fasting blood sugar level. ? If you are at a normal weight and have a low risk for diabetes, have this test once every three years after the age of 45. ? If you are overweight and have a high risk for diabetes, consider being tested at a younger age or more often.  A one-time screening for abdominal aortic aneurysm (AAA) by ultrasound is recommended for men aged 65-75 years who are current or former smokers. What should I know about preventing infection? Hepatitis B If you have a higher risk for hepatitis B, you should be screened for this virus. Talk with your health care provider to find out if you are at risk for hepatitis B infection. Hepatitis C Blood testing is recommended for:  Everyone born from 1945 through 1965.  Anyone with known risk factors for hepatitis C.  Sexually Transmitted Diseases (STDs)  You should be screened each year for STDs including gonorrhea and chlamydia if: ? You are sexually active and are younger than 37 years of age. ? You are older than 37 years of age and your health care provider tells you that you are at risk for this type of infection. ? Your sexual activity has changed since you were last  screened and you are at an increased risk for chlamydia or gonorrhea. Ask your health care provider if you are at risk.  Talk with your health care provider about whether you are at high risk of being infected with HIV. Your health care provider may recommend a prescription medicine to help prevent HIV infection.  What else can I do?  Schedule regular health, dental, and eye exams.  Stay current with your vaccines (immunizations).  Do not use any tobacco products, such as cigarettes, chewing tobacco, and e-cigarettes. If you need help quitting, ask your health care provider.  Limit alcohol intake to no more than 2 drinks per day. One drink equals 12 ounces of beer, 5 ounces of wine, or 1 ounces of hard liquor.  Do not use street drugs.  Do not share needles.  Ask your health care provider for help if you need support or information about quitting drugs.  Tell your health care   provider if you often feel depressed.  Tell your health care provider if you have ever been abused or do not feel safe at home. This information is not intended to replace advice given to you by your health care provider. Make sure you discuss any questions you have with your health care provider. Document Released: 08/31/2007 Document Revised: 11/01/2015 Document Reviewed: 12/06/2014 Elsevier Interactive Patient Education  2018 [employer]  American Heart Association (AHA) Exercise Recommendation  Being physically active is important to prevent heart disease and stroke, the nation's No. 1and No. 5killers. To improve overall cardiovascular health, we suggest at least 150 minutes per week of moderate exercise or 75 minutes per week of vigorous exercise (or a combination of moderate and vigorous activity). Thirty minutes a day, five times a week is an easy goal to remember. You will also experience benefits even if you divide your time into two or three segments of 10 to 15 minutes per day.  For people who would  benefit from lowering their blood pressure or cholesterol, we recommend 40 minutes of aerobic exercise of moderate to vigorous intensity three to four times a week to lower the risk for heart attack and stroke.  Physical activity is anything that makes you move your body and burn calories.  This includes things like climbing stairs or playing sports. Aerobic exercises benefit your heart, and include walking, jogging, swimming or biking. Strength and stretching exercises are best for overall stamina and flexibility.  The simplest, positive change you can make to effectively improve your heart health is to start walking. It's enjoyable, free, easy, social and great exercise. A walking program is flexible and boasts high success rates because people can stick with it. It's easy for walking to become a regular and satisfying part of life.   For Overall Cardiovascular Health:  At least 30 minutes of moderate-intensity aerobic activity at least 5 days per week for a total of 150  OR   At least 25 minutes of vigorous aerobic activity at least 3 days per week for a total of 75 minutes; or a combination of moderate- and vigorous-intensity aerobic activity  AND   Moderate- to high-intensity muscle-strengthening activity at least 2 days per week for additional health benefits.  For Lowering Blood Pressure and Cholesterol  An average 40 minutes of moderate- to vigorous-intensity aerobic activity 3 or 4 times per week  What if I can't make it to the time goal? Something is always better than nothing! And everyone has to start somewhere. Even if you've been sedentary for years, today is the day you can begin to make healthy changes in your life. If you don't think you'll make it for 30 or 40 minutes, set a reachable goal for today. You can work up toward your overall goal by increasing your time as you get stronger. Don't let all-or-nothing thinking rob you of doing what you can every day.   Source:http://www.heart.org    

## 2017-04-18 NOTE — Progress Notes (Signed)
Jesse Houston 37 y.o.   Chief Complaint  Patient presents with  . Annual Exam    HISTORY OF PRESENT ILLNESS: This is a 37 y.o. male Here for annual exam; no complaints and no medical concerns.   HPI   Prior to Admission medications   Medication Sig Start Date End Date Taking? Authorizing Provider  Multiple Vitamin (MULTIVITAMIN) tablet Take 1 tablet by mouth daily.   Yes [provider]  clindamycin (CLEOCIN) 150 MG capsule Take 2 capsules (300 mg total) by mouth every 6 (six) hours. Patient not taking: Reported on 04/18/2017 02/29/16   Gwyneth Sprout, MD    No Known Allergies  Patient Active Problem List   Diagnosis Date Noted  . Fatigue 08/02/2011  . Low testosterone 08/02/2011    Past Medical History:  Diagnosis Date  . Allergy   . Asthma   . Low testosterone     No past surgical history on file.  Social History   Socioeconomic History  . Marital status: Married    Spouse name: Not on file  . Number of children: Not on file  . Years of education: Not on file  . Highest education level: Not on file  Social Needs  . Financial resource strain: Not on file  . Food insecurity - worry: Not on file  . Food insecurity - inability: Not on file  . Transportation needs - medical: Not on file  . Transportation needs - non-medical: Not on file  Occupational History  . Not on file  Tobacco Use  . Smoking status: Never Smoker  . Smokeless tobacco: Never Used  Substance and Sexual Activity  . Alcohol use: Yes    Alcohol/week: 1.0 - 1.5 oz    Types: 2 - 3 Standard drinks or equivalent per week  . Drug use: No  . Sexual activity: Yes    Birth control/protection: Condom  Other Topics Concern  . Not on file  Social History Narrative  . Not on file    Family History  Problem Relation Age of Onset  . Arthritis Mother   . Brain cancer Son   . Arthritis Maternal Grandmother   . Diabetes Maternal Grandfather   . Uterine cancer Paternal Grandmother     . Cirrhosis Paternal Grandfather      Review of Systems  Constitutional: Negative.  Negative for chills, fever and weight loss.  HENT: Negative.  Negative for congestion, nosebleeds and sore throat.   Eyes: Negative.   Respiratory: Negative.  Negative for cough, hemoptysis and shortness of breath.   Cardiovascular: Negative.  Negative for chest pain and palpitations.  Gastrointestinal: Negative for abdominal pain, nausea and vomiting.  Genitourinary: Negative for dysuria and hematuria.       ED  Skin: Negative.  Negative for rash.  Neurological: Negative for dizziness and headaches.  All other systems reviewed and are negative.   Vitals:   04/18/17 0836  BP: (!) 110/58  Pulse: (!) 58  Resp: 16  Temp: (!) 97.4 F (36.3 C)  SpO2: 97%    Physical Exam  Constitutional: He is oriented to person, place, and time. He appears well-developed and well-nourished.  HENT:  Head: Normocephalic and atraumatic.  Nose: Nose normal.  Mouth/Throat: Oropharynx is clear and moist. No oropharyngeal exudate.  Eyes: Conjunctivae and EOM are normal. Pupils are equal, round, and reactive to light.  Neck: Normal range of motion. Neck supple. No JVD present. No thyromegaly present.  Cardiovascular: Normal rate, regular rhythm and normal heart sounds.  Pulmonary/Chest: Effort normal and breath sounds normal.  Abdominal: Soft. Bowel sounds are normal. He exhibits no distension and no mass. There is no tenderness. There is no rebound.  Musculoskeletal: Normal range of motion.  Lymphadenopathy:    He has no cervical adenopathy.  Neurological: He is alert and oriented to person, place, and time. No sensory deficit. He exhibits normal muscle tone.  Skin: Skin is warm and dry. Capillary refill takes less than 2 seconds. No rash noted.  Psychiatric: He has a normal mood and affect. His behavior is normal.  Vitals reviewed.    ASSESSMENT & PLAN: Jesse Houston was seen today for annual exam.  Diagnoses and  all orders for this visit:  Routine general medical examination at a health care facility -     CBC with Differential -     Comprehensive metabolic panel -     Hemoglobin A1c -     Lipid panel -     HIV antibody  Erectile dysfunction, unspecified erectile dysfunction type -     sildenafil (VIAGRA) 100 MG tablet; Take 0.5-1 tablets (50-100 mg total) by mouth daily as needed for erectile dysfunction.    Patient Instructions       IF you received an x-ray today, you will [facility]. Please contact [facility] at [phone] with questions or concerns regarding your invoice.   IF you received labwork today, you will receive an invoice from [facility]. Please contact [facility] at [phone] with questions or concerns regarding your invoice.   Our billing staff will not be able to assist you with questions regarding bills from these companies.  You will be contacted with the lab results as soon as they are available. The fastest way to get your results is to activate your My Chart account. Instructions are located on the last page of this paperwork. If you have not heard from us regarding the results in 2 weeks, please contact this office.      Health Maintenance, Male A healthy lifestyle and preventive care is important for your health and wellness. Ask your health care provider about what schedule of regular examinations is right for you. What should I know about weight and diet? Eat a Healthy Diet  Eat plenty of vegetables, fruits, whole grains, low-fat dairy products, and lean protein.  Do not eat a lot of foods high in solid fats, added sugars, or salt.  Maintain a Healthy Weight Regular exercise can help you achieve or maintain a healthy weight. You should:  Do at least 150 minutes of exercise each week. The exercise should increase your heart rate and make you sweat (moderate-intensity exercise).  Do strength-training exercises  at least twice a week.  Watch Your Levels of Cholesterol and Blood Lipids  Have your blood tested for lipids and cholesterol every 5 years starting at 37 years of age. If you are at high risk for heart disease, you should start having your blood tested when you are 37 years old. You may need to have your cholesterol levels checked more often if: ? Your lipid or cholesterol levels are high. ? You are older than 37 years of age. ? You are at high risk for heart disease.  What should I know about cancer screening? Many types of cancers can be detected early and may often be prevented. Lung Cancer  You should be screened every year for lung cancer if: ? You are a current smoker who has smoked for at least 30  years. ? You are a former smoker who has quit within the past 15 years.  Talk to your health care provider about your screening options, when you should start screening, and how often you should be screened.  Colorectal Cancer  Routine colorectal cancer screening usually begins at 37 years of age and should be repeated every 5-10 years until you are 37 years old. You may need to be screened more often if early forms of precancerous polyps or small growths are found. Your health care provider may recommend screening at an earlier age if you have risk factors for colon cancer.  Your health care provider may recommend using home test kits to check for hidden blood in the stool.  A small camera at the end of a tube can be used to examine your colon (sigmoidoscopy or colonoscopy). This checks for the earliest forms of colorectal cancer.  Prostate and Testicular Cancer  Depending on your age and overall health, your health care provider may do certain tests to screen for prostate and testicular cancer.  Talk to your health care provider about any symptoms or concerns you have about testicular or prostate cancer.  Skin Cancer  Check your skin from head to toe regularly.  Tell your  health care provider about any new moles or changes in moles, especially if: ? There is a change in a mole's size, shape, or color. ? You have a mole that is larger than a pencil eraser.  Always use sunscreen. Apply sunscreen liberally and repeat throughout the day.  Protect yourself by wearing long sleeves, pants, a wide-brimmed hat, and sunglasses when outside.  What should I know about heart disease, diabetes, and high blood pressure?  If you are 74-79 years of age, have your blood pressure checked every 3-5 years. If you are 28 years of age or older, have your blood pressure checked every year. You should have your blood pressure measured twice-once when you are at a hospital or clinic, and once when you are not at a hospital or clinic. Record the average of the two measurements. To check your blood pressure when you are not at a hospital or clinic, you can use: ? An automated blood pressure machine at a pharmacy. ? A home blood pressure monitor.  Talk to your health care provider about your target blood pressure.  If you are between 57-81 years old, ask your health care provider if you should take aspirin to prevent heart disease.  Have regular diabetes screenings by checking your fasting blood sugar level. ? If you are at a normal weight and have a low risk for diabetes, have this test once every three years after the age of 71. ? If you are overweight and have a high risk for diabetes, consider being tested at a younger age or more often.  A one-time screening for abdominal aortic aneurysm (AAA) by ultrasound is recommended for men aged 65-75 years who are current or former smokers. What should I know about preventing infection? Hepatitis B If you have a higher risk for hepatitis B, you should be screened for this virus. Talk with your health care provider to find out if you are at risk for hepatitis B infection. Hepatitis C Blood testing is recommended for:  Everyone born from  50 through 1965.  Anyone with known risk factors for hepatitis C.  Sexually Transmitted Diseases (STDs)  You should be screened each year for STDs including gonorrhea and chlamydia if: ? You are sexually active  and are younger than 37 years of age. ? You are older than 37 years of age and your health care provider tells you that you are at risk for this type of infection. ? Your sexual activity has changed since you were last screened and you are at an increased risk for chlamydia or gonorrhea. Ask your health care provider if you are at risk.  Talk with your health care provider about whether you are at high risk of being infected with HIV. Your health care provider may recommend a prescription medicine to help prevent HIV infection.  What else can I do?  Schedule regular health, dental, and eye exams.  Stay current with your vaccines (immunizations).  Do not use any tobacco products, such as cigarettes, chewing tobacco, and e-cigarettes. If you need help quitting, ask your health care provider.  Limit alcohol intake to no more than 2 drinks per day. One drink equals 12 ounces of beer, 5 ounces of wine, or 1 ounces of hard liquor.  Do not use street drugs.  Do not share needles.  Ask your health care provider for help if you need support or information about quitting drugs.  Tell your health care provider if you often feel depressed.  Tell your health care provider if you have ever been abused or do not feel safe at home. This information is not intended to replace advice given to you by your health care provider. Make sure you discuss any questions you have with your health care provider. Document Released: 08/31/2007 Document Revised: 11/01/2015 Document Reviewed: 12/06/2014 Elsevier Interactive Patient Education  2018 [employer].  American Heart Association (AHA) Exercise Recommendation  Being physically active is important to prevent heart disease and stroke, the  nation's No. 1and No. 5killers. To improve overall cardiovascular health, we suggest at least 150 minutes per week of moderate exercise or 75 minutes per week of vigorous exercise (or a combination of moderate and vigorous activity). Thirty minutes a day, five times a week is an easy goal to remember. You will also experience benefits even if you divide your time into two or three segments of 10 to 15 minutes per day.  For people who would benefit from lowering their blood pressure or cholesterol, we recommend 40 minutes of aerobic exercise of moderate to vigorous intensity three to four times a week to lower the risk for heart attack and stroke.  Physical activity is anything that makes you move your body and burn calories.  This includes things like climbing stairs or playing sports. Aerobic exercises benefit your heart, and include walking, jogging, swimming or biking. Strength and stretching exercises are best for overall stamina and flexibility.  The simplest, positive change you can make to effectively improve your heart health is to start walking. It's enjoyable, free, easy, social and great exercise. A walking program is flexible and boasts high success rates because people can stick with it. It's easy for walking to become a regular and satisfying part of life.   For Overall Cardiovascular Health:  At least 30 minutes of moderate-intensity aerobic activity at least 5 days per week for a total of 150  OR   At least 25 minutes of vigorous aerobic activity at least 3 days per week for a total of 75 minutes; or a combination of moderate- and vigorous-intensity aerobic activity  AND   Moderate- to high-intensity muscle-strengthening activity at least 2 days per week for additional health benefits.  For Lowering Blood Pressure and Cholesterol  An average 40 minutes of moderate- to vigorous-intensity aerobic activity 3 or 4 times per week  What if I can't make it to the time  goal? Something is always better than nothing! And everyone has to start somewhere. Even if you've been sedentary for years, today is the day you can begin to make healthy changes in your life. If you don't think you'll make it for 30 or 40 minutes, set a reachable goal for today. You can work up toward your overall goal by increasing your time as you get stronger. Don't let all-or-nothing thinking rob you of doing what you can every day.  Source:[email or web address], MD [facility]

## 2017-04-19 LAB — COMPREHENSIVE METABOLIC PANEL
A/G RATIO: 1.9 (ref 1.2–2.2)
ALK PHOS: 86 IU/L (ref 39–117)
ALT: 19 IU/L (ref 0–44)
AST: 26 IU/L (ref 0–40)
Albumin: 4.4 g/dL (ref 3.5–5.5)
BILIRUBIN TOTAL: 0.6 mg/dL (ref 0.0–1.2)
BUN/Creatinine Ratio: 14 (ref 9–20)
BUN: 13 mg/dL (ref 6–20)
CHLORIDE: 101 mmol/L (ref 96–106)
CO2: 23 mmol/L (ref 20–29)
Calcium: 9.5 mg/dL (ref 8.7–10.2)
Creatinine, Ser: 0.94 mg/dL (ref 0.76–1.27)
GFR calc Af Amer: 120 mL/min/{1.73_m2} (ref >59)
GFR calc non Af Amer: 104 mL/min/{1.73_m2} (ref >59)
GLOBULIN, TOTAL: 2.3 g/dL (ref 1.5–4.5)
Glucose: 88 mg/dL (ref 65–99)
POTASSIUM: 4.6 mmol/L (ref 3.5–5.2)
SODIUM: 140 mmol/L (ref 134–144)
Total Protein: 6.7 g/dL (ref 6.0–8.5)

## 2017-04-19 LAB — CBC WITH DIFFERENTIAL/PLATELET
Basophils Absolute: 0 10*3/uL (ref 0.0–0.2)
Basos: 0 %
EOS (ABSOLUTE): 0.2 10*3/uL (ref 0.0–0.4)
EOS: 2 %
HEMATOCRIT: 45.6 % (ref 37.5–51.0)
Hemoglobin: 15.2 g/dL (ref 13.0–17.7)
Immature Grans (Abs): 0 10*3/uL (ref 0.0–0.1)
Immature Granulocytes: 0 %
LYMPHS ABS: 2.6 10*3/uL (ref 0.7–3.1)
Lymphs: 36 %
MCH: 29.6 pg (ref 26.6–33.0)
MCHC: 33.3 g/dL (ref 31.5–35.7)
MCV: 89 fL (ref 79–97)
MONOS ABS: 0.5 10*3/uL (ref 0.1–0.9)
Monocytes: 7 %
Neutrophils Absolute: 3.9 10*3/uL (ref 1.4–7.0)
Neutrophils: 55 %
Platelets: 378 10*3/uL (ref 150–379)
RBC: 5.14 x10E6/uL (ref 4.14–5.80)
RDW: 13.1 % (ref 12.3–15.4)
WBC: 7.2 10*3/uL (ref 3.4–10.8)

## 2017-04-19 LAB — LIPID PANEL
Chol/HDL Ratio: 4 ratio (ref 0.0–5.0)
Cholesterol, Total: 160 mg/dL (ref 100–199)
HDL: 40 mg/dL (ref >39)
LDL Calculated: 103 mg/dL — ABNORMAL HIGH (ref 0–99)
Triglycerides: 85 mg/dL (ref 0–149)
VLDL Cholesterol Cal: 17 mg/dL (ref 5–40)

## 2017-04-19 LAB — HIV ANTIBODY (ROUTINE TESTING W REFLEX): HIV SCREEN 4TH GENERATION: NONREACTIVE

## 2017-04-19 LAB — HEMOGLOBIN A1C
ESTIMATED AVERAGE GLUCOSE: 100 mg/dL
HEMOGLOBIN A1C: 5.1 % (ref 4.8–5.6)

## 2017-04-21 ENCOUNTER — Encounter: Payer: Self-pay | Admitting: *Deleted

## 2017-06-01 ENCOUNTER — Inpatient Hospital Stay (HOSPITAL_COMMUNITY)
Admission: EM | Admit: 2017-06-01 | Discharge: 2017-06-05 | DRG: 392 | Disposition: A | Discharge status: Home / Self Care | Payer: BLUE CROSS/BLUE SHIELD | Attending: General Surgery | Admitting: General Surgery

## 2017-06-01 ENCOUNTER — Emergency Department (HOSPITAL_COMMUNITY): Payer: BLUE CROSS/BLUE SHIELD

## 2017-06-01 ENCOUNTER — Other Ambulatory Visit: Payer: Self-pay

## 2017-06-01 ENCOUNTER — Encounter (HOSPITAL_COMMUNITY): Payer: Self-pay | Admitting: *Deleted

## 2017-06-01 DIAGNOSIS — R1031 Right lower quadrant pain: Secondary | ICD-10-CM | POA: Diagnosis not present

## 2017-06-01 DIAGNOSIS — R1084 Generalized abdominal pain: Secondary | ICD-10-CM | POA: Diagnosis not present

## 2017-06-01 DIAGNOSIS — K5720 Diverticulitis of large intestine with perforation and abscess without bleeding: Secondary | ICD-10-CM | POA: Diagnosis not present

## 2017-06-01 DIAGNOSIS — Z79899 Other long term (current) drug therapy: Secondary | ICD-10-CM | POA: Diagnosis not present

## 2017-06-01 DIAGNOSIS — R1032 Left lower quadrant pain: Secondary | ICD-10-CM | POA: Diagnosis not present

## 2017-06-01 DIAGNOSIS — K5730 Diverticulosis of large intestine without perforation or abscess without bleeding: Secondary | ICD-10-CM | POA: Diagnosis not present

## 2017-06-01 LAB — CBC
HEMATOCRIT: 42.7 % (ref 39.0–52.0)
Hemoglobin: 14.1 g/dL (ref 13.0–17.0)
MCH: 29.1 pg (ref 26.0–34.0)
MCHC: 33 g/dL (ref 30.0–36.0)
MCV: 88 fL (ref 78.0–100.0)
PLATELETS: 291 10*3/uL (ref 150–400)
RBC: 4.85 MIL/uL (ref 4.22–5.81)
RDW: 12.7 % (ref 11.5–15.5)
WBC: 19.1 10*3/uL — ABNORMAL HIGH (ref 4.0–10.5)

## 2017-06-01 LAB — URINALYSIS, ROUTINE W REFLEX MICROSCOPIC
BACTERIA UA: NONE SEEN
Bilirubin Urine: NEGATIVE
GLUCOSE, UA: NEGATIVE mg/dL
Ketones, ur: NEGATIVE mg/dL
Leukocytes, UA: NEGATIVE
Nitrite: NEGATIVE
Protein, ur: NEGATIVE mg/dL
SPECIFIC GRAVITY, URINE: 1.009 (ref 1.005–1.030)
SQUAMOUS EPITHELIAL / LPF: NONE SEEN
pH: 7 (ref 5.0–8.0)

## 2017-06-01 LAB — COMPREHENSIVE METABOLIC PANEL
ALBUMIN: 4.1 g/dL (ref 3.5–5.0)
ALT: 17 U/L (ref 17–63)
AST: 22 U/L (ref 15–41)
Alkaline Phosphatase: 74 U/L (ref 38–126)
Anion gap: 9 (ref 5–15)
BILIRUBIN TOTAL: 1.7 mg/dL — AB (ref 0.3–1.2)
BUN: 9 mg/dL (ref 6–20)
CHLORIDE: 101 mmol/L (ref 101–111)
CO2: 25 mmol/L (ref 22–32)
CREATININE: 1.01 mg/dL (ref 0.61–1.24)
Calcium: 9.1 mg/dL (ref 8.9–10.3)
GFR calc Af Amer: >60 mL/min (ref >60)
GLUCOSE: 107 mg/dL — AB (ref 65–99)
POTASSIUM: 4.2 mmol/L (ref 3.5–5.1)
Sodium: 135 mmol/L (ref 135–145)
TOTAL PROTEIN: 7.1 g/dL (ref 6.5–8.1)

## 2017-06-01 LAB — LIPASE, BLOOD: Lipase: 20 U/L (ref 11–51)

## 2017-06-01 MED ORDER — ONDANSETRON 4 MG PO TBDP: 4.0000 mg | ORAL_TABLET | Freq: Four times a day (QID) | ORAL | Status: DC | PRN

## 2017-06-01 MED ORDER — DIPHENHYDRAMINE HCL 50 MG/ML IJ SOLN: 25.0000 mg | Freq: Four times a day (QID) | INTRAMUSCULAR | Status: DC | PRN

## 2017-06-01 MED ORDER — PIPERACILLIN-TAZOBACTAM 3.375 G IVPB 30 MIN
3.3750 g | Freq: Once | INTRAVENOUS | Status: AC
  Administered 2017-06-01: 3.375 g via INTRAVENOUS
  Filled 2017-06-01: qty 50

## 2017-06-01 MED ORDER — OXYCODONE-ACETAMINOPHEN 5-325 MG PO TABS
1.0000 | ORAL_TABLET | ORAL | Status: DC | PRN
  Administered 2017-06-01: 1 via ORAL
  Filled 2017-06-01: qty 1

## 2017-06-01 MED ORDER — ACETAMINOPHEN 650 MG RE SUPP: 650.0000 mg | Freq: Four times a day (QID) | RECTAL | Status: DC | PRN

## 2017-06-01 MED ORDER — METOPROLOL TARTRATE 5 MG/5ML IV SOLN: 5.0000 mg | Freq: Four times a day (QID) | INTRAVENOUS | Status: DC | PRN

## 2017-06-01 MED ORDER — ENOXAPARIN SODIUM 40 MG/0.4ML ~~LOC~~ SOLN
40.0000 mg | SUBCUTANEOUS | Status: DC
  Administered 2017-06-01 – 2017-06-03 (×3): 40 mg via SUBCUTANEOUS
  Filled 2017-06-01 (×3): qty 0.4

## 2017-06-01 MED ORDER — ONDANSETRON HCL 4 MG/2ML IJ SOLN: 4.0000 mg | Freq: Four times a day (QID) | INTRAMUSCULAR | Status: DC | PRN

## 2017-06-01 MED ORDER — SODIUM CHLORIDE 0.9 % IV BOLUS (SEPSIS)
1000.0000 mL | Freq: Once | INTRAVENOUS | Status: AC
  Administered 2017-06-01: 1000 mL via INTRAVENOUS

## 2017-06-01 MED ORDER — DIPHENHYDRAMINE HCL 25 MG PO CAPS: 25.0000 mg | ORAL_CAPSULE | Freq: Four times a day (QID) | ORAL | Status: DC | PRN

## 2017-06-01 MED ORDER — PIPERACILLIN-TAZOBACTAM 3.375 G IVPB
3.3750 g | Freq: Three times a day (TID) | INTRAVENOUS | Status: DC
  Administered 2017-06-02 – 2017-06-05 (×11): 3.375 g via INTRAVENOUS
  Filled 2017-06-01 (×12): qty 50

## 2017-06-01 MED ORDER — MORPHINE SULFATE (PF) 4 MG/ML IV SOLN
2.0000 mg | INTRAVENOUS | Status: DC | PRN
  Administered 2017-06-02: 2 mg via INTRAVENOUS
  Filled 2017-06-01: qty 1

## 2017-06-01 MED ORDER — IOPAMIDOL (ISOVUE-300) INJECTION 61%
INTRAVENOUS | Status: AC
  Administered 2017-06-01: 100 mL
  Filled 2017-06-01: qty 100

## 2017-06-01 MED ORDER — OXYCODONE HCL 5 MG PO TABS
5.0000 mg | ORAL_TABLET | ORAL | Status: DC | PRN
  Administered 2017-06-01 – 2017-06-02 (×2): 10 mg via ORAL
  Filled 2017-06-01 (×2): qty 2

## 2017-06-01 MED ORDER — ACETAMINOPHEN 325 MG PO TABS
650.0000 mg | ORAL_TABLET | Freq: Four times a day (QID) | ORAL | Status: DC | PRN
  Administered 2017-06-02 – 2017-06-04 (×2): 650 mg via ORAL
  Filled 2017-06-01 (×2): qty 2

## 2017-06-01 NOTE — ED Notes (Signed)
Attempted to call report x2

## 2017-06-01 NOTE — ED Triage Notes (Signed)
PT sent here by [facility] for r/o appy.  States suprapubic pain that started last night and radiates into his groin.  Denies urinary s/s. Denies nvd.  Also c/o chills and body aches.

## 2017-06-01 NOTE — H&P (Signed)
Jesse Houston is an 37 y.o. male.   Chief Complaint: abdominal pain HPI: 37 yo male with 2 days of abdominal pain. He has some nausea, no vomiting. He last had a normal bowel movement this morning. He denies fevers or chills. He has never had a diverticulitis attack before. He has never had a colonoscopy.  Past Medical History:  Diagnosis Date  . Allergy   . Asthma   . Low testosterone     History reviewed. No pertinent surgical history.  Family History  Problem Relation Age of Onset  . Arthritis Mother   . Brain cancer Son   . Arthritis Maternal Grandmother   . Diabetes Maternal Grandfather   . Uterine cancer Paternal Grandmother   . Cirrhosis Paternal Grandfather    Social History:  reports that  has never smoked. he has never used smokeless tobacco. He reports that he drinks about 1.0 - 1.5 oz of alcohol per week. He reports that he does not use drugs.  Allergies: No Known Allergies   (Not in a hospital admission)  Results for orders placed or performed during the hospital encounter of 06/01/17 (from the past 48 hour(s))  Lipase, blood     Status: None   Collection Time: 06/01/17  3:48 PM  Result Value Ref Range   Lipase 20 11 - 51 U/L    Comment: Performed at [facility], [address], [place], [place] [address]  Comprehensive metabolic panel     Status: Abnormal   Collection Time: 06/01/17  3:48 PM  Result Value Ref Range   Sodium 135 135 - 145 mmol/L   Potassium 4.2 3.5 - 5.1 mmol/L   Chloride 101 101 - 111 mmol/L   CO2 25 22 - 32 mmol/L   Glucose, Bld 107 (H) 65 - 99 mg/dL   BUN 9 6 - 20 mg/dL   Creatinine, Ser 1.01 0.61 - 1.24 mg/dL   Calcium 9.1 8.9 - 10.3 mg/dL   Total Protein 7.1 6.5 - 8.1 g/dL   Albumin 4.1 3.5 - 5.0 g/dL   AST 22 15 - 41 U/L   ALT 17 17 - 63 U/L   Alkaline Phosphatase 74 38 - 126 U/L   Total Bilirubin 1.7 (H) 0.3 - 1.2 mg/dL   GFR calc non Af Amer >60 >60 mL/min   GFR calc Af Amer >60 >60 mL/min    Comment: (NOTE) The eGFR  has been calculated using the CKD EPI equation. This calculation has not been validated in all clinical situations. eGFR's persistently <60 mL/min signify possible Chronic Kidney Disease.    Anion gap 9 5 - 15    Comment: Performed at [facility] [address] [place], [place] [address]  CBC     Status: Abnormal   Collection Time: 06/01/17  3:48 PM  Result Value Ref Range   WBC 19.1 (H) 4.0 - 10.5 K/uL   RBC 4.85 4.22 - 5.81 MIL/uL   Hemoglobin 14.1 13.0 - 17.0 g/dL   HCT 42.7 39.0 - 52.0 %   MCV 88.0 78.0 - 100.0 fL   MCH 29.1 26.0 - 34.0 pg   MCHC 33.0 30.0 - 36.0 g/dL   RDW 12.7 11.5 - 15.5 %   Platelets 291 150 - 400 K/uL    Comment: Performed at [facility], [place] [address] [place], [place] [address]  Urinalysis, Routine w reflex microscopic     Status: Abnormal   Collection Time: 06/01/17  3:50 PM  Result Value Ref Range   Color, Urine YELLOW YELLOW   APPearance CLEAR CLEAR   Specific Gravity, Urine 1.009 1.005 - 1.030   pH 7.0 5.0 - 8.0   Glucose, UA NEGATIVE NEGATIVE mg/dL   Hgb urine dipstick SMALL (A) NEGATIVE   Bilirubin Urine NEGATIVE NEGATIVE   Ketones, ur NEGATIVE NEGATIVE mg/dL   Protein, ur NEGATIVE NEGATIVE mg/dL   Nitrite NEGATIVE NEGATIVE   Leukocytes, UA NEGATIVE NEGATIVE   RBC / HPF 0-5 0 - 5 RBC/hpf   WBC, UA 0-5 0 - 5 WBC/hpf   Bacteria, UA NONE SEEN NONE SEEN   Squamous Epithelial / LPF NONE SEEN NONE SEEN    Comment: Performed at [facility], [place] [address], [place], [place] [address]   Ct Abdomen Pelvis W Contrast  Result Date: 06/01/2017 CLINICAL DATA:  37 year old male with acute generalized abdominal pain radiating into the groin since yesterday evening. EXAM: CT ABDOMEN AND PELVIS WITH CONTRAST TECHNIQUE: Multidetector CT imaging of the abdomen and pelvis was performed using the standard protocol following bolus administration of intravenous contrast. CONTRAST:  180m ISOVUE-300 IOPAMIDOL (ISOVUE-300) INJECTION 61%  COMPARISON:  None. FINDINGS: Lower chest: Unremarkable. Hepatobiliary: No suspicious cystic or solid hepatic lesions. No intra or extrahepatic biliary ductal dilatation. Gallbladder is normal in appearance. Pancreas: No pancreatic mass. No pancreatic ductal dilatation. No pancreatic or peripancreatic fluid or inflammatory changes. Spleen: Unremarkable. Adrenals/Urinary Tract: Bilateral kidneys and bilateral adrenal glands are normal in appearance. No hydroureteronephrosis. Urinary bladder is normal in appearance. Stomach/Bowel: Normal appearance of the stomach. No pathologic dilatation of small bowel or colon. Normal appendix. A few scattered colonic diverticulae are noted, and in the proximal sigmoid colon there are surrounding inflammatory changes indicative of an acute diverticulitis. Adjacent to this there several tiny locules of gas which are clearly extraluminal. No discrete diverticular abscess. Vascular/Lymphatic: No significant atherosclerotic disease, aneurysm or dissection noted in the abdominal or pelvic vasculature. No lymphadenopathy noted in the abdomen or pelvis. Reproductive: Prostate gland and seminal vesicles are unremarkable in appearance. Other: Small volume of pneumoperitoneum scattered throughout the peritoneal cavity. No significant volume of ascites. Musculoskeletal: No aggressive appearing lytic or blastic lesions are noted in the visualized portions of the skeleton. IMPRESSION: 1. Findings are compatible with an acute diverticulitis of the proximal sigmoid colon, with evidence of perforation demonstrated by small volume of pneumoperitoneum throughout the peritoneal cavity. Surgical consultation is strongly recommended. Critical Value/emergent results were called by telephone at the time of interpretation on 06/01/2017 at 6:47 pm to Dr. JElnora Morrison who verbally acknowledged these results. Electronically Signed   By: DVinnie LangtonM.D.   On: 06/01/2017 18:48    Review of Systems   Constitutional: Negative for chills and fever.  HENT: Negative for hearing loss.   Eyes: Negative for blurred vision and double vision.  Respiratory: Negative for cough and hemoptysis.   Cardiovascular: Negative for chest pain and palpitations.  Gastrointestinal: Positive for abdominal pain and nausea. Negative for vomiting.  Genitourinary: Negative for dysuria and urgency.  Musculoskeletal: Negative for myalgias and neck pain.  Skin: Negative for itching and rash.  Neurological: Negative for dizziness, tingling and headaches.  Endo/Heme/Allergies: Does not bruise/bleed easily.  Psychiatric/Behavioral: Negative for depression and suicidal ideas.    Blood pressure 121/72, pulse 88, temperature 99 F (37.2 C), temperature source Oral, resp. rate 16, height '6\' 3"'  (1.905 m), weight 81.2 kg (179 lb), SpO2 100 %. Physical Exam  Vitals reviewed. Constitutional: He is oriented to person, place,  and time. He appears well-developed and well-nourished.  HENT:  Head: Normocephalic and atraumatic.  Eyes: Conjunctivae and EOM are normal. Pupils are equal, round, and reactive to light.  Neck: Normal range of motion. Neck supple.  Cardiovascular: Normal rate and regular rhythm.  Respiratory: Effort normal and breath sounds normal.  GI: Soft. Bowel sounds are normal. He exhibits no distension. There is tenderness in the right lower quadrant and left lower quadrant. There is no rigidity and no guarding.  Musculoskeletal: Normal range of motion.  Neurological: He is alert and oriented to person, place, and time.  Skin: Skin is warm and dry.  Psychiatric: He has a normal mood and affect. His behavior is normal.     Assessment/Plan 37 yo male with Henchey III perforated diverticulitis with leukocytosis. His vitals are normal. He has no signs of peritonitis on exam. He does have a moderate amount of air including some next to his liver, but given his exam I think he is a good candidate for nonoperative  treatment. -admit to surgery -IV abx -serial labs and abdominal exams -pain control -bowel rest -discussed that if his pain worsens, he develops fevers and leukocytosis worsens that we would proceed with partial colectomy with end colostomy with the plan to reverse in 3 months. He showed good understanding  Mickeal Skinner, MD 06/01/2017, 7:15 PM

## 2017-06-01 NOTE — ED Notes (Signed)
Attempted to call report x 1  

## 2017-06-01 NOTE — ED Provider Notes (Signed)
[facility] EMERGENCY DEPARTMENT Provider Note   CSN: [id number] Arrival date & time: 06/01/17  1449     History   Chief Complaint Chief Complaint  Patient presents with  . Abdominal Pain    r/o appy    HPI Jesse Houston is a 37 y.o. male.  Patient presents with suprapubic tenderness since last night fairly constant. No history of similar. No history of kidney stone.Patient denies any urinary changes. No vomiting however patient has had chills.patient denies surgery history. Nothing is improved the pain. Patient has history of asthma.      Past Medical History:  Diagnosis Date  . Allergy   . Asthma   . Low testosterone     Patient Active Problem List   Diagnosis Date Noted  . Fatigue 08/02/2011  . Low testosterone 08/02/2011    History reviewed. No pertinent surgical history.     Home Medications    Prior to Admission medications   Medication Sig Start Date End Date Taking? Authorizing Provider  clindamycin (CLEOCIN) 150 MG capsule Take 2 capsules (300 mg total) by mouth every 6 (six) hours. Patient not taking: Reported on 04/18/2017 02/29/16   Gwyneth Sprout, MD  Multiple Vitamin (MULTIVITAMIN) tablet Take 1 tablet by mouth daily.    [provider]  sildenafil (VIAGRA) 100 MG tablet Take 0.5-1 tablets (50-100 mg total) by mouth daily as needed for erectile dysfunction. 04/18/17   Georgina Quint, MD    Family History Family History  Problem Relation Age of Onset  . Arthritis Mother   . Brain cancer Son   . Arthritis Maternal Grandmother   . Diabetes Maternal Grandfather   . Uterine cancer Paternal Grandmother   . Cirrhosis Paternal Grandfather     Social History Social History   Tobacco Use  . Smoking status: Never Smoker  . Smokeless tobacco: Never Used  Substance Use Topics  . Alcohol use: Yes    Alcohol/week: 1.0 - 1.5 oz    Types: 2 - 3 Standard drinks or equivalent per week  . Drug use: No      Allergies   Patient has no known allergies.   Review of Systems Review of Systems   Physical Exam Updated Vital Signs BP 121/72 (BP Location: Right Arm)   Pulse 88   Temp 99 F (37.2 C) (Oral)   Resp 16   Ht 6\' 3"  (1.905 m)   Wt 81.2 kg (179 lb)   SpO2 100%   BMI 22.37 kg/m   Physical Exam  Constitutional: He is oriented to person, place, and time. He appears well-developed and well-nourished.  HENT:  Head: Normocephalic and atraumatic.  Eyes: Conjunctivae are normal. Right eye exhibits no discharge. Left eye exhibits no discharge.  Neck: Normal range of motion. Neck supple. No tracheal deviation present.  Cardiovascular: Normal rate and regular rhythm.  Pulmonary/Chest: Effort normal and breath sounds normal.  Abdominal: Soft. He exhibits no distension. There is no tenderness. There is guarding.  Suprapubic and LLQ tenderness  Musculoskeletal: He exhibits no edema.  Neurological: He is alert and oriented to person, place, and time.  Skin: Skin is warm. No rash noted.  Psychiatric: He has a normal mood and affect.  Nursing note and vitals reviewed.    ED Treatments / Results  Labs (all labs ordered are listed, but only abnormal results are displayed) Labs Reviewed  COMPREHENSIVE METABOLIC PANEL - Abnormal; Notable for the following components:      Result Value  Glucose, Bld 107 (*)    Total Bilirubin 1.7 (*)    All other components within normal limits  CBC - Abnormal; Notable for the following components:   WBC 19.1 (*)    All other components within normal limits  URINALYSIS, ROUTINE W REFLEX MICROSCOPIC - Abnormal; Notable for the following components:   Hgb urine dipstick SMALL (*)    All other components within normal limits  LIPASE, BLOOD    EKG  EKG Interpretation None       Radiology Ct Abdomen Pelvis W Contrast  Result Date: 06/01/2017 CLINICAL DATA:  37 year old male with acute generalized abdominal pain radiating into the groin  since yesterday evening. EXAM: CT ABDOMEN AND PELVIS WITH CONTRAST TECHNIQUE: Multidetector CT imaging of the abdomen and pelvis was performed using the standard protocol following bolus administration of intravenous contrast. CONTRAST:  100mL ISOVUE-300 IOPAMIDOL (ISOVUE-300) INJECTION 61% COMPARISON:  None. FINDINGS: Lower chest: Unremarkable. Hepatobiliary: No suspicious cystic or solid hepatic lesions. No intra or extrahepatic biliary ductal dilatation. Gallbladder is normal in appearance. Pancreas: No pancreatic mass. No pancreatic ductal dilatation. No pancreatic or peripancreatic fluid or inflammatory changes. Spleen: Unremarkable. Adrenals/Urinary Tract: Bilateral kidneys and bilateral adrenal glands are normal in appearance. No hydroureteronephrosis. Urinary bladder is normal in appearance. Stomach/Bowel: Normal appearance of the stomach. No pathologic dilatation of small bowel or colon. Normal appendix. A few scattered colonic diverticulae are noted, and in the proximal sigmoid colon there are surrounding inflammatory changes indicative of an acute diverticulitis. Adjacent to this there several tiny locules of gas which are clearly extraluminal. No discrete diverticular abscess. Vascular/Lymphatic: No significant atherosclerotic disease, aneurysm or dissection noted in the abdominal or pelvic vasculature. No lymphadenopathy noted in the abdomen or pelvis. Reproductive: Prostate gland and seminal vesicles are unremarkable in appearance. Other: Small volume of pneumoperitoneum scattered throughout the peritoneal cavity. No significant volume of ascites. Musculoskeletal: No aggressive appearing lytic or blastic lesions are noted in the visualized portions of the skeleton. IMPRESSION: 1. Findings are compatible with an acute diverticulitis of the proximal sigmoid colon, with evidence of perforation demonstrated by small volume of pneumoperitoneum throughout the peritoneal cavity. Surgical consultation is  strongly recommended. Critical Value/emergent results were called by telephone at the time of interpretation on 06/01/2017 at 6:47 pm to Dr. Blane OharaJOSHUA Yamin Swingler, who verbally acknowledged these results. Electronically Signed   By: Trudie Reedaniel  Entrikin M.D.   On: 06/01/2017 18:48    Procedures .Critical Care Performed by: Blane OharaZavitz, Toby Ayad, MD Authorized by: Blane OharaZavitz, Carliss Quast, MD   Critical care provider statement:    Critical care time (minutes):  35   Critical care start time:  06/01/2017 6:30 PM   Critical care end time:  06/01/2017 7:05 PM   Critical care time was exclusive of:  Separately billable procedures and treating other patients and teaching time   Critical care was time spent personally by me on the following activities:  Ordering and performing treatments and interventions, examination of patient, discussions with consultants and re-evaluation of patient's condition   I assumed direction of critical care for this patient from another provider in my specialty: no   Comments:     Perforated colon, IV abx, specialty support   (including critical care time)  Medications Ordered in ED Medications  oxyCODONE-acetaminophen (PERCOCET/ROXICET) 5-325 MG per tablet 1 tablet (1 tablet Oral Given 06/01/17 1546)  piperacillin-tazobactam (ZOSYN) IVPB 3.375 g (not administered)  sodium chloride 0.9 % bolus 1,000 mL (0 mLs Intravenous Stopped 06/01/17 1854)  iopamidol (ISOVUE-300) 61 %  injection (100 mLs  Contrast Given 06/01/17 1743)     Initial Impression / Assessment and Plan / ED Course  I have reviewed the triage vital signs and the nursing notes.  Pertinent labs & imaging results that were available during my care of the patient were reviewed by me and considered in my medical decision making (see chart for details).    Patient presents with worsening lower abdominal pain. Patient has significant tenderness with leukocytosis. CT scan obtained and results discussed with radiology consistent with  perforated diverticulitis. IV antibiotics were immediately, IV fluids and page general surgery.  The patients results and plan were reviewed and discussed.   Any x-rays performed were independently reviewed by myself.   Differential diagnosis were considered with the presenting HPI.  Medications  oxyCODONE-acetaminophen (PERCOCET/ROXICET) 5-325 MG per tablet 1 tablet (1 tablet Oral Given 06/01/17 1546)  piperacillin-tazobactam (ZOSYN) IVPB 3.375 g (not administered)  sodium chloride 0.9 % bolus 1,000 mL (0 mLs Intravenous Stopped 06/01/17 1854)  iopamidol (ISOVUE-300) 61 % injection (100 mLs  Contrast Given 06/01/17 1743)    Vitals:   06/01/17 1541  BP: 121/72  Pulse: 88  Resp: 16  Temp: 99 F (37.2 C)  TempSrc: Oral  SpO2: 100%  Weight: 81.2 kg (179 lb)  Height: 6\' 3"  (1.905 m)    Final diagnoses:  Perforated diverticulum of large intestine    Admission/ observation were discussed with the admitting physician, patient and/or family and they are comfortable with the plan.    Final Clinical Impressions(s) / ED Diagnoses   Final diagnoses:  Perforated diverticulum of large intestine    ED Discharge Orders    None       Blane Ohara, MD 06/02/17 [phone]

## 2017-06-01 NOTE — Progress Notes (Signed)
Received patient from ED accompanied by friends, AOx4, ambulatory, VS stable, O2Sat at 100% on RA and abdominal pain at 1/10, oriented to room, bed controls and call light.  Discussed with patient's plan of care.

## 2017-06-01 NOTE — ED Notes (Signed)
Patient asked for urine sample, unable to give sample at this time. 

## 2017-06-02 LAB — CBC
HEMATOCRIT: 40.1 % (ref 39.0–52.0)
HEMOGLOBIN: 13.5 g/dL (ref 13.0–17.0)
MCH: 29.8 pg (ref 26.0–34.0)
MCHC: 33.7 g/dL (ref 30.0–36.0)
MCV: 88.5 fL (ref 78.0–100.0)
Platelets: 283 10*3/uL (ref 150–400)
RBC: 4.53 MIL/uL (ref 4.22–5.81)
RDW: 12.7 % (ref 11.5–15.5)
WBC: 18.2 10*3/uL — ABNORMAL HIGH (ref 4.0–10.5)

## 2017-06-02 MED ORDER — SODIUM CHLORIDE 0.9 % IV SOLN: INTRAVENOUS | Status: DC

## 2017-06-02 MED ORDER — SODIUM CHLORIDE 0.9 % IV SOLN
INTRAVENOUS | Status: DC
Start: 2017-06-02 — End: 2017-06-05
  Administered 2017-06-02 – 2017-06-05 (×7): via INTRAVENOUS

## 2017-06-02 NOTE — Progress Notes (Signed)
[facility] Progress Note      Assessment/Plan Diverticulitis - 1st episode - perforation with pneumoperitoneum - no signs of peritonitis on exam, will continue nonoperative treatment unless his pain worsens, he develops fevers and leukocytosis worsens then pt will need a partial colectomy with end colostomy  FEN: NPO allow ice chips and water VTE: SCD's, lovenox ID: [id number] 03/17>> Foley: none Follow up: TBD  DISPO: Nonoperative treatment unless pt worsens clinically    LOS: 1 day    Subjective: CC; abdominal pain  Pain improved from yesterday. No nausea, vomiting or fevers. Pt had chills overnight. Pt wants water  Objective: Vital signs in last 24 hours: Temp:  [97.9 F (36.6 C)-99.4 F (37.4 C)] 99.4 F (37.4 C) (03/18 0535) Pulse Rate:  [88-94] 94 (03/18 0535) Resp:  [16] 16 (03/18 0535) BP: (108-121)/(60-72) 120/61 (03/18 0535) SpO2:  [95 %-100 %] 95 % (03/18 0535) Weight:  [179 lb (81.2 kg)-179 lb 0.2 oz (81.2 kg)] 179 lb 0.2 oz (81.2 kg) (03/17 2005) Last BM Date: 06/01/17  Intake/Output from previous day: 03/17 0701 - 03/18 0700 In: 1160 [P.O.:60; IV Piggyback:1100] Out: -  Intake/Output this shift: No intake/output data recorded.  PE: Gen:  Alert, NAD, pleasant, cooperative Card:  RRR, no M/G/R heard Pulm:  CTA, no W/R/R, effort normal Abd: Soft, not distended, +BS, no HSM, TTP (5/10) of LLQ no guarding or peritonitis  Skin: no rashes noted, warm and dry   Anti-infectives: Anti-infectives (From admission, onward)   Start     Dose/Rate Route Frequency Ordered Stop   06/02/17 0200  piperacillin-tazobactam (ZOSYN) IVPB 3.375 g     3.375 g 12.5 mL/hr over 240 Minutes Intravenous Every 8 hours 06/01/17 2333     06/01/17 1900  piperacillin-tazobactam (ZOSYN) IVPB 3.375 g     3.375 g 100 mL/hr over 30 Minutes Intravenous  Once 06/01/17 1848 06/01/17 1946      Lab Results:  Recent Labs    06/01/17 1548 06/02/17 0426  WBC  19.1* 18.2*  HGB 14.1 13.5  HCT 42.7 40.1  PLT 291 283   BMET Recent Labs    06/01/17 1548  NA 135  K 4.2  CL 101  CO2 25  GLUCOSE 107*  BUN 9  CREATININE 1.01  CALCIUM 9.1   PT/INR No results for input(s): LABPROT, INR in the last 72 hours. CMP     Component Value Date/Time   NA 135 06/01/2017 1548   NA 140 04/18/2017 0941   K 4.2 06/01/2017 1548   CL 101 06/01/2017 1548   CO2 25 06/01/2017 1548   GLUCOSE 107 (H) 06/01/2017 1548   BUN 9 06/01/2017 1548   BUN 13 04/18/2017 0941   CREATININE 1.01 06/01/2017 1548   CREATININE 0.84 07/08/2014 0927   CALCIUM 9.1 06/01/2017 1548   PROT 7.1 06/01/2017 1548   PROT 6.7 04/18/2017 0941   ALBUMIN 4.1 06/01/2017 1548   ALBUMIN 4.4 04/18/2017 0941   AST 22 06/01/2017 1548   ALT 17 06/01/2017 1548   ALKPHOS 74 06/01/2017 1548   BILITOT 1.7 (H) 06/01/2017 1548   BILITOT 0.6 04/18/2017 0941   GFRNONAA >60 06/01/2017 1548   GFRAA >60 06/01/2017 1548   Lipase     Component Value Date/Time   LIPASE 20 06/01/2017 1548    Studies/Results: Ct Abdomen Pelvis W Contrast  Result Date: 06/01/2017 CLINICAL DATA:  37 year old male with acute generalized abdominal pain radiating into the groin since yesterday evening. EXAM: CT ABDOMEN AND PELVIS  WITH CONTRAST TECHNIQUE: Multidetector CT imaging of the abdomen and pelvis was performed using the standard protocol following bolus administration of intravenous contrast. CONTRAST:  ISOVUE-300 IOPAMIDOL (ISOVUE-300) INJECTION 61% COMPARISON:  None. FINDINGS: Lower chest: Unremarkable. Hepatobiliary: No suspicious cystic or solid hepatic lesions. No intra or extrahepatic biliary ductal dilatation. Gallbladder is normal in appearance. Pancreas: No pancreatic mass. No pancreatic ductal dilatation. No pancreatic or peripancreatic fluid or inflammatory changes. Spleen: Unremarkable. Adrenals/Urinary Tract: Bilateral kidneys and bilateral adrenal glands are normal in appearance. No  hydroureteronephrosis. Urinary bladder is normal in appearance. Stomach/Bowel: Normal appearance of the stomach. No pathologic dilatation of small bowel or colon. Normal appendix. A few scattered colonic diverticulae are noted, and in the proximal sigmoid colon there are surrounding inflammatory changes indicative of an acute diverticulitis. Adjacent to this there several tiny locules of gas which are clearly extraluminal. No discrete diverticular abscess. Vascular/Lymphatic: No significant atherosclerotic disease, aneurysm or dissection noted in the abdominal or pelvic vasculature. No lymphadenopathy noted in the abdomen or pelvis. Reproductive: Prostate gland and seminal vesicles are unremarkable in appearance. Other: Small volume of pneumoperitoneum scattered throughout the peritoneal cavity. No significant volume of ascites. Musculoskeletal: No aggressive appearing lytic or blastic lesions are noted in the visualized portions of the skeleton. IMPRESSION: 1. Findings are compatible with an acute diverticulitis of the proximal sigmoid colon, with evidence of perforation demonstrated by small volume of pneumoperitoneum throughout the peritoneal cavity. Surgical consultation is strongly recommended. Critical Value/emergent results were called by telephone at the time of interpretation on 06/01/2017 at 6:47 pm to Dr. Blane Ohara, who verbally acknowledged these results. Electronically Signed   By: Trudie Reed M.D.   On: 06/01/2017 18:48      Jerre Simon , [facility] 06/02/2017, 7:57 AM  Pager: [phone] Mon-Wed, Friday 7:00am-4:30pm Thurs 7am-11:30am  Consults: [phone]

## 2017-06-03 LAB — CBC
HEMATOCRIT: 40 % (ref 39.0–52.0)
Hemoglobin: 13.5 g/dL (ref 13.0–17.0)
MCH: 30.2 pg (ref 26.0–34.0)
MCHC: 33.8 g/dL (ref 30.0–36.0)
MCV: 89.5 fL (ref 78.0–100.0)
PLATELETS: 290 10*3/uL (ref 150–400)
RBC: 4.47 MIL/uL (ref 4.22–5.81)
RDW: 12.9 % (ref 11.5–15.5)
WBC: 14.9 10*3/uL — ABNORMAL HIGH (ref 4.0–10.5)

## 2017-06-03 MED ORDER — BOOST / RESOURCE BREEZE PO LIQD CUSTOM
1.0000 | Freq: Three times a day (TID) | ORAL | Status: DC
  Administered 2017-06-03: 1 via ORAL

## 2017-06-03 NOTE — Progress Notes (Signed)
[facility]/Trauma Progress Note      Assessment/Plan Diverticulitis - 1st episode - perforation with pneumoperitoneum - no signs of peritonitis on exam, will continue nonoperative treatment unless his pain worsens, he develops fevers and leukocytosis worsens then pt will need a partial colectomy with end colostomy - WBC trending down, 14.9 today down from 18.2, Tmax overnight 100.7  FEN: clears, IVF VTE: SCD's, lovenox ID: Zosyn 03/17>> Foley: none Follow up: TBD  DISPO: Nonoperative treatment unless pt worsens clinically. Clears    LOS: 2 days    Subjective: CC: abdominal pain  Pt states he is feeling better and his pain is improved. He is not taking pain medication. He denies nausea or vomiting. One episode of fever overnight. No new complaints.   Objective: Vital signs in last 24 hours: Temp:  [98.3 F (36.8 C)-100.7 F (38.2 C)] 98.4 F (36.9 C) (03/19 0454) Pulse Rate:  [81-106] 81 (03/19 0454) Resp:  [16-18] 18 (03/19 0454) BP: (95-116)/(46-64) 105/46 (03/19 0454) SpO2:  [96 %-100 %] 98 % (03/19 0454) Last BM Date: 06/02/17  Intake/Output from previous day: 03/18 0701 - 03/19 0700 In: 2253 [P.O.:120; I.V.:2044; IV Piggyback:89] Out: -  Intake/Output this shift: No intake/output data recorded.  PE: Gen:  Alert, NAD, pleasant, cooperative Card:  RRR, no M/G/R heard Pulm:  CTA, no W/R/R, effort normal Abd: Soft, not distended, +BS, no HSM, mild TTP of LLQ no guarding or peritonitis  Skin: no rashes noted, warm and dry    Anti-infectives: Anti-infectives (From admission, onward)   Start     Dose/Rate Route Frequency Ordered Stop   06/02/17 0200  piperacillin-tazobactam (ZOSYN) IVPB 3.375 g     3.375 g 12.5 mL/hr over 240 Minutes Intravenous Every 8 hours 06/01/17 2333     06/01/17 1900  piperacillin-tazobactam (ZOSYN) IVPB 3.375 g     3.375 g 100 mL/hr over 30 Minutes Intravenous  Once 06/01/17 1848 06/01/17 1946      Lab  Results:  Recent Labs    06/02/17 0426 06/03/17 0850  WBC 18.2* 14.9*  HGB 13.5 13.5  HCT 40.1 40.0  PLT 283 290   BMET Recent Labs    06/01/17 1548  NA 135  K 4.2  CL 101  CO2 25  GLUCOSE 107*  BUN 9  CREATININE 1.01  CALCIUM 9.1   PT/INR No results for input(s): LABPROT, INR in the last 72 hours. CMP     Component Value Date/Time   NA 135 06/01/2017 1548   NA 140 04/18/2017 0941   K 4.2 06/01/2017 1548   CL 101 06/01/2017 1548   CO2 25 06/01/2017 1548   GLUCOSE 107 (H) 06/01/2017 1548   BUN 9 06/01/2017 1548   BUN 13 04/18/2017 0941   CREATININE 1.01 06/01/2017 1548   CREATININE 0.84 07/08/2014 0927   CALCIUM 9.1 06/01/2017 1548   PROT 7.1 06/01/2017 1548   PROT 6.7 04/18/2017 0941   ALBUMIN 4.1 06/01/2017 1548   ALBUMIN 4.4 04/18/2017 0941   AST 22 06/01/2017 1548   ALT 17 06/01/2017 1548   ALKPHOS 74 06/01/2017 1548   BILITOT 1.7 (H) 06/01/2017 1548   BILITOT 0.6 04/18/2017 0941   GFRNONAA >60 06/01/2017 1548   GFRAA >60 06/01/2017 1548   Lipase     Component Value Date/Time   LIPASE 20 06/01/2017 1548    Studies/Results: Ct Abdomen Pelvis W Contrast  Result Date: 06/01/2017 CLINICAL DATA:  37 year old male with acute generalized abdominal pain radiating into the groin since  yesterday evening. EXAM: CT ABDOMEN AND PELVIS WITH CONTRAST TECHNIQUE: Multidetector CT imaging of the abdomen and pelvis was performed using the standard protocol following bolus administration of intravenous contrast. CONTRAST:  ISOVUE-300 IOPAMIDOL (ISOVUE-300) INJECTION 61% COMPARISON:  None. FINDINGS: Lower chest: Unremarkable. Hepatobiliary: No suspicious cystic or solid hepatic lesions. No intra or extrahepatic biliary ductal dilatation. Gallbladder is normal in appearance. Pancreas: No pancreatic mass. No pancreatic ductal dilatation. No pancreatic or peripancreatic fluid or inflammatory changes. Spleen: Unremarkable. Adrenals/Urinary Tract: Bilateral kidneys and  bilateral adrenal glands are normal in appearance. No hydroureteronephrosis. Urinary bladder is normal in appearance. Stomach/Bowel: Normal appearance of the stomach. No pathologic dilatation of small bowel or colon. Normal appendix. A few scattered colonic diverticulae are noted, and in the proximal sigmoid colon there are surrounding inflammatory changes indicative of an acute diverticulitis. Adjacent to this there several tiny locules of gas which are clearly extraluminal. No discrete diverticular abscess. Vascular/Lymphatic: No significant atherosclerotic disease, aneurysm or dissection noted in the abdominal or pelvic vasculature. No lymphadenopathy noted in the abdomen or pelvis. Reproductive: Prostate gland and seminal vesicles are unremarkable in appearance. Other: Small volume of pneumoperitoneum scattered throughout the peritoneal cavity. No significant volume of ascites. Musculoskeletal: No aggressive appearing lytic or blastic lesions are noted in the visualized portions of the skeleton. IMPRESSION: 1. Findings are compatible with an acute diverticulitis of the proximal sigmoid colon, with evidence of perforation demonstrated by small volume of pneumoperitoneum throughout the peritoneal cavity. Surgical consultation is strongly recommended. Critical Value/emergent results were called by telephone at the time of interpretation on 06/01/2017 at 6:47 pm to Dr. Blane Ohara, who verbally acknowledged these results. Electronically Signed   By: Trudie Reed M.D.   On: 06/01/2017 18:48      Jerre Simon , [facility] 06/03/2017, 9:25 AM  Pager: [phone] Mon-Wed, Friday 7:00am-4:30pm Thurs 7am-11:30am  Consults: [phone]

## 2017-06-04 LAB — COMPREHENSIVE METABOLIC PANEL
ALBUMIN: 2.9 g/dL — AB (ref 3.5–5.0)
ALK PHOS: 55 U/L (ref 38–126)
ALT: 11 U/L — ABNORMAL LOW (ref 17–63)
AST: 14 U/L — AB (ref 15–41)
Anion gap: 10 (ref 5–15)
BILIRUBIN TOTAL: 1.2 mg/dL (ref 0.3–1.2)
BUN: 5 mg/dL — AB (ref 6–20)
CALCIUM: 8.5 mg/dL — AB (ref 8.9–10.3)
CO2: 25 mmol/L (ref 22–32)
CREATININE: 0.93 mg/dL (ref 0.61–1.24)
Chloride: 105 mmol/L (ref 101–111)
GFR calc Af Amer: >60 mL/min (ref >60)
GFR calc non Af Amer: >60 mL/min (ref >60)
GLUCOSE: 91 mg/dL (ref 65–99)
Potassium: 3.5 mmol/L (ref 3.5–5.1)
Sodium: 140 mmol/L (ref 135–145)
TOTAL PROTEIN: 5.9 g/dL — AB (ref 6.5–8.1)

## 2017-06-04 LAB — CBC
HEMATOCRIT: 37.7 % — AB (ref 39.0–52.0)
HEMOGLOBIN: 12.5 g/dL — AB (ref 13.0–17.0)
MCH: 29.4 pg (ref 26.0–34.0)
MCHC: 33.2 g/dL (ref 30.0–36.0)
MCV: 88.7 fL (ref 78.0–100.0)
Platelets: 297 10*3/uL (ref 150–400)
RBC: 4.25 MIL/uL (ref 4.22–5.81)
RDW: 12.4 % (ref 11.5–15.5)
WBC: 11.2 10*3/uL — AB (ref 4.0–10.5)

## 2017-06-04 NOTE — Progress Notes (Signed)
Pt advanced to Full liquid diet for breakfast, at this time, he is tolerating diet well.

## 2017-06-04 NOTE — Progress Notes (Signed)
[facility]/Tra[facility] Progress Note      Assessment/Plan Diverticulitis - 1st episode - perforation with pneumoperitoneum - no signs of peritonitis on exam, will continue nonoperative treatment unless his pain worsens, he develops fevers and leukocytosis worsens then pt will need a partial colectomy with end colostomy - WBC trending down, 11.2 today down from 14.9, Tmax overnight 99.2  GBT:DVVOHFEN:fulls, IVF VTE: SCD's, lovenox YW:VPXTG:Zosyn 03/17>> Foley:none Follow up:TBD  DISPO:Continues to improve. Nonoperative treatment unless pt worsens clinically. fulls   LOS: 3 days    Subjective: CC: abdominal pain  Pain is very mild and improved from yesterday. Not taking any pain medicine. Tolerating clears. No nausea or vomiting. Chills overnight. No fever. No new complaints. No family at bedside.   Objective: Vital signs in last 24 hours: Temp:  [98.2 F (36.8 C)-99.2 F (37.3 C)] 98.2 F (36.8 C) (03/20 62690608) Pulse Rate:  [63-77] 63 (03/20 0608) Resp:  [17-18] 17 (03/20 0608) BP: (107-117)/(59-68) 107/59 (03/20 0608) SpO2:  [98 %-100 %] 99 % (03/20 0608) Last BM Date: 06/01/17  Intake/Output from previous day: 03/19 0701 - 03/20 0700 In: 3375.3 [P.O.:972; I.V.:2253.3; IV Piggyback:150] Out: -  Intake/Output this shift: No intake/output data recorded.  PE: Gen: Alert, NAD, pleasant, cooperative Card: RRR, no M/G/R heard Pulm: CTA, no W/R/R, effort normal Abd: Soft,not distended, +BS,noHSM, very mild TTP of LLQ no guarding or peritonitis Skin: no rashes noted, warm and dry    Anti-infectives: Anti-infectives (From admission, onward)   Start     Dose/Rate Route Frequency Ordered Stop   06/02/17 0200  piperacillin-tazobactam (ZOSYN) IVPB 3.375 g     3.375 g 12.5 mL/hr over 240 Minutes Intravenous Every 8 hours 06/01/17 2333     06/01/17 1900  piperacillin-tazobactam (ZOSYN) IVPB 3.375 g     3.375 g 100 mL/hr over 30 Minutes Intravenous  Once  06/01/17 1848 06/01/17 1946      Lab Results:  Recent Labs    06/03/17 0850 06/04/17 0523  WBC 14.9* 11.2*  HGB 13.5 12.5*  HCT 40.0 37.7*  PLT 290 297   BMET Recent Labs    06/01/17 1548 06/04/17 0523  NA 135 140  K 4.2 3.5  CL 101 105  CO2 25 25  GLUCOSE 107* 91  BUN 9 5*  CREATININE 1.01 0.93  CALCIUM 9.1 8.5*   PT/INR No results for input(s): LABPROT, INR in the last 72 hours. CMP     Component Value Date/Time   NA 140 06/04/2017 0523   NA 140 04/18/2017 0941   K 3.5 06/04/2017 0523   CL 105 06/04/2017 0523   CO2 25 06/04/2017 0523   GLUCOSE 91 06/04/2017 0523   BUN 5 (L) 06/04/2017 0523   BUN 13 04/18/2017 0941   CREATININE 0.93 06/04/2017 0523   CREATININE 0.84 07/08/2014 0927   CALCIUM 8.5 (L) 06/04/2017 0523   PROT 5.9 (L) 06/04/2017 0523   PROT 6.7 04/18/2017 0941   ALBUMIN 2.9 (L) 06/04/2017 0523   ALBUMIN 4.4 04/18/2017 0941   AST 14 (L) 06/04/2017 0523   ALT 11 (L) 06/04/2017 0523   ALKPHOS 55 06/04/2017 0523   BILITOT 1.2 06/04/2017 0523   BILITOT 0.6 04/18/2017 0941   GFRNONAA >60 06/04/2017 0523   GFRAA >60 06/04/2017 0523   Lipase     Component Value Date/Time   LIPASE 20 06/01/2017 1548    Studies/Results: No results found.    Jerre SimonJessica L Jakylan Ron , PA-C [facility] 06/04/2017, 8:15 AM  Pager:  [phone] Mon-Wed, Friday 7:00am-4:30pm Thurs 7am-11:30am  Consults: [phone]

## 2017-06-05 LAB — CBC
HEMATOCRIT: 38.7 % — AB (ref 39.0–52.0)
Hemoglobin: 13.3 g/dL (ref 13.0–17.0)
MCH: 30.1 pg (ref 26.0–34.0)
MCHC: 34.4 g/dL (ref 30.0–36.0)
MCV: 87.6 fL (ref 78.0–100.0)
Platelets: 331 10*3/uL (ref 150–400)
RBC: 4.42 MIL/uL (ref 4.22–5.81)
RDW: 12.2 % (ref 11.5–15.5)
WBC: 9.3 10*3/uL (ref 4.0–10.5)

## 2017-06-05 MED ORDER — ACETAMINOPHEN 325 MG PO TABS: 650.0000 mg | ORAL_TABLET | Freq: Four times a day (QID) | ORAL | Status: DC | PRN

## 2017-06-05 MED ORDER — AMOXICILLIN-POT CLAVULANATE 875-125 MG PO TABS: 1.0000 | ORAL_TABLET | Freq: Two times a day (BID) | ORAL | 0 refills | Status: AC

## 2017-06-05 NOTE — Discharge Summary (Signed)
[facility] Discharge Summary   Patient ID: Jesse ChangGabriel Houston MRN: [id number] DOB/AGE: 37/05/1980 37 y.o.  Admit date: 06/01/2017 Discharge date: 06/05/2017  Admitting Diagnosis: Diverticulitis with perforation   Discharge Diagnosis Patient Active Problem List   Diagnosis Date Noted  . Diverticulitis of colon with perforation 06/01/2017  . Fatigue 08/02/2011  . Low testosterone 08/02/2011    Consultants None  Imaging: No results found.  Procedures None  Hospital Course:  Patient is an otherwise healthy 37 year old male who [facility].  Workup showed sigmoid diverticulitis with perforation.  Patient was admitted and treated conservatively with IV antibiotics. Patient improved clinically with IV antibiotics and diet was advanced as tolerated.  On 06/04/17, the patient was voiding well, tolerating diet, ambulating well, pain well controlled, vital signs stable and felt stable for discharge home.  Patient will follow up in our office in 6 weeks and knows to call with questions or concerns.  He will call to confirm appointment date/time.     Allergies as of 06/05/2017   No Known Allergies     Medication List    STOP taking these medications   clindamycin 150 MG capsule Commonly known as:  CLEOCIN     TAKE these medications   acetaminophen 325 MG tablet Commonly known as:  TYLENOL Take 2 tablets (650 mg total) by mouth every 6 (six) hours as needed for mild pain (or temp > 100).   amoxicillin-clavulanate 875-125 MG tablet Commonly known as:  AUGMENTIN Take 1 tablet by mouth every 12 (twelve) hours for 7 days.   ibuprofen 200 MG tablet Commonly known as:  ADVIL,MOTRIN Take 400 mg by mouth every 6 (six) hours as needed (for pain).   multivitamin tablet Take 1 tablet by mouth daily.   sildenafil 100 MG tablet Commonly known as:  VIAGRA Take 0.5-1 tablets (50-100 mg total) by mouth daily as needed for erectile dysfunction.         Follow-up Information    Kinsinger, De BlanchLuke Aaron, MD. Schedule an appointment as soon as possible for a visit in 6 week(s).   Specialty:  General Surgery Why:  for follow up regarding possible elective surgery.  Contact information: [address] [place] [address] [phone]           Signed: Wells GuilesKelly Rayburn, [facility] 06/05/2017, 1:45 PM Pager: [phone] Consults: [phone] Mon-Fri 7:00 am-4:30 pm Sat-Sun 7:00 am-11:30 am

## 2017-06-05 NOTE — Discharge Instructions (Signed)
Diverticulitis Diverticulitis is when small pockets in your large intestine (colon) get infected or swollen. This causes stomach pain and watery poop (diarrhea). These pouches are called diverticula. They form in people who have a condition called diverticulosis. Follow these instructions at home: Medicines  Take over-the-counter and prescription medicines only as told by your doctor. These include: ? Antibiotics. ? Pain medicines. ? Fiber pills. ? Probiotics. ? Stool softeners.  Do not drive or use heavy machinery while taking prescription pain medicine.  If you were prescribed an antibiotic, take it as told. Do not stop taking it even if you feel better. General instructions  Follow a diet as told by your doctor.  When you feel better, your doctor may tell you to change your diet. You may need to eat a lot of fiber. Fiber makes it easier to poop (have bowel movements). Healthy foods with fiber include: ? Berries. ? Beans. ? Lentils. ? Green vegetables.  Exercise 3 or more times a week. Aim for 30 minutes each time. Exercise enough to sweat and make your heart beat faster.  Keep all follow-up visits as told. This is important. You may need to have an exam of the large intestine. This is called a colonoscopy. Contact a doctor if:  Your pain does not get better.  You have a hard time eating or drinking.  You are not pooping like normal. Get help right away if:  Your pain gets worse.  Your problems do not get better.  Your problems get worse very fast.  You have a fever.  You throw up (vomit) more than one time.  You have poop that is: ? Bloody. ? Black. ? Tarry. Summary  Diverticulitis is when small pockets in your large intestine (colon) get infected or swollen.  Take medicines only as told by your doctor.  Follow a diet as told by your doctor. This information is not intended to replace advice given to you by your health care provider. Make sure you discuss  any questions you have with your health care provider. Document Released: 08/21/2007 Document Revised: 03/21/2016 Document Reviewed: 03/21/2016 Elsevier Interactive Patient Education  2017 Elsevier Inc.  High-Fiber Diet Fiber, also called dietary fiber, is a type of carbohydrate found in fruits, vegetables, whole grains, and beans. A high-fiber diet can have many health benefits. Your health care provider may recommend a high-fiber diet to help: Prevent constipation. Fiber can make your bowel movements more regular. Lower your cholesterol. Relieve hemorrhoids, uncomplicated diverticulosis, or irritable bowel syndrome. Prevent overeating as part of a weight-loss plan. Prevent heart disease, type 2 diabetes, and certain cancers.  What is my plan? The recommended daily intake of fiber includes: 38 grams for men under age 37. 30 grams for men over age 37. 25 grams for women under age 450. 21 grams for women over age 37.  You can get the recommended daily intake of dietary fiber by eating a variety of fruits, vegetables, grains, and beans. Your health care provider may also recommend a fiber supplement if it is not possible to get enough fiber through your diet. What do I need to know about a high-fiber diet? Fiber supplements have not been widely studied for their effectiveness, so it is better to get fiber through food sources. Always check the fiber content on thenutrition facts label of any prepackaged food. Look for foods that contain at least 5 grams of fiber per serving. Ask your dietitian if you have questions about specific foods that are related  to your condition, especially if those foods are not listed in the following section. Increase your daily fiber consumption gradually. Increasing your intake of dietary fiber too quickly may cause bloating, cramping, or gas. Drink plenty of water. Water helps you to digest fiber. What foods can I eat? Grains Whole-grain breads. Multigrain  cereal. Oats and oatmeal. Brown rice. Barley. Bulgur wheat. Millet. Bran muffins. Popcorn. Rye wafer crackers. Vegetables Sweet potatoes. Spinach. Kale. Artichokes. Cabbage. Broccoli. Green peas. Carrots. Squash. Fruits Berries. Pears. Apples. Oranges. Avocados. Prunes and raisins. Dried figs. Meats and Other Protein Sources Navy, kidney, pinto, and soy beans. Split peas. Lentils. Nuts and seeds. Dairy Fiber-fortified yogurt. Beverages Fiber-fortified soy milk. Fiber-fortified orange juice. Other Fiber bars. The items listed above may not be a complete list of recommended foods or beverages. Contact your dietitian for more options. What foods are not recommended? Grains White bread. Pasta made with refined flour. White rice. Vegetables Fried potatoes. Canned vegetables. Well-cooked vegetables. Fruits Fruit juice. Cooked, strained fruit. Meats and Other Protein Sources Fatty cuts of meat. Fried Environmental education officer or fried fish. Dairy Milk. Yogurt. Cream cheese. Sour cream. Beverages Soft drinks. Other Cakes and pastries. Butter and oils. The items listed above may not be a complete list of foods and beverages to avoid. Contact your dietitian for more information. What are some tips for including high-fiber foods in my diet? Eat a wide variety of high-fiber foods. Make sure that half of all grains consumed each day are whole grains. Replace breads and cereals made from refined flour or white flour with whole-grain breads and cereals. Replace white rice with brown rice, bulgur wheat, or millet. Start the day with a breakfast that is high in fiber, such as a cereal that contains at least 5 grams of fiber per serving. Use beans in place of meat in soups, salads, or pasta. Eat high-fiber snacks, such as berries, raw vegetables, nuts, or popcorn. This information is not intended to replace advice given to you by your health care provider. Make sure you discuss any questions you have with your  health care provider. Document Released: 03/04/2005 Document Revised: 08/10/2015 Document Reviewed: 08/17/2013 Elsevier Interactive Patient Education  [employer].

## 2017-06-05 NOTE — Progress Notes (Signed)
[facility] Progress Note      Assessment/Plan Diverticulitis - 1st episode - perforation with pneumoperitoneum - no signs of peritonitis on exam, will continue nonoperative treatment unless his pain worsens, he develops fevers and leukocytosis worsens then pt will need a partial colectomy with end colostomy - WBC trending down, 9.3 today down from 11.2, Tmax overnight 98.7  ZOX:WRUEFEN:soft diet VTE: SCD's, lovenox AV:WUJWJ:Zosyn 03/17>>   WBC 9.3 today Foley:none Follow up:TBD  DISPO:Continues to improve. Nonoperative treatment unless pt worsens clinically. soft diet and home tomorrow if he does not have increase of pain, fevers or increase in WBC. Am labs pending   LOS: 4 days    Subjective: CC: diverticulitis  Pt states no abdominal pain. He had 5 BM's yesterday after having yogurt and milk. He thinks he is lactose intolerant. No nausea or vomiting, fever or chills.   Objective: Vital signs in last 24 hours: Temp:  [98 F (36.7 C)-98.7 F (37.1 C)] 98 F (36.7 C) (03/21 0450) Pulse Rate:  [64-74] 64 (03/21 0450) Resp:  [18] 18 (03/20 2219) BP: (100-119)/(61-68) 100/61 (03/21 0450) SpO2:  [98 %-99 %] 99 % (03/21 0450) Last BM Date: 06/04/17  Intake/Output from previous day: 03/20 0701 - 03/21 0700 In: 2005.8 [P.O.:480; I.V.:1375.8; IV Piggyback:150] Out: -  Intake/Output this shift: No intake/output data recorded.  PE: Gen: Alert, NAD, pleasant, cooperative Card: RRR, no M/G/R heard Pulm: CTA, no W/R/R, effort normal Abd: Soft,not distended, +BS,noHSM,very mildTTP of LLQ no guarding or peritonitis Skin: no rashes noted, warm and dry   Anti-infectives: Anti-infectives (From admission, onward)   Start     Dose/Rate Route Frequency Ordered Stop   06/02/17 0200  piperacillin-tazobactam (ZOSYN) IVPB 3.375 g     3.375 g 12.5 mL/hr over 240 Minutes Intravenous Every 8 hours 06/01/17 2333     06/01/17 1900  piperacillin-tazobactam (ZOSYN)  IVPB 3.375 g     3.375 g 100 mL/hr over 30 Minutes Intravenous  Once 06/01/17 1848 06/01/17 1946      Lab Results:  Recent Labs    06/04/17 0523 06/05/17 0739  WBC 11.2* 9.3  HGB 12.5* 13.3  HCT 37.7* 38.7*  PLT 297 331   BMET Recent Labs    06/04/17 0523  NA 140  K 3.5  CL 105  CO2 25  GLUCOSE 91  BUN 5*  CREATININE 0.93  CALCIUM 8.5*   PT/INR No results for input(s): LABPROT, INR in the last 72 hours. CMP     Component Value Date/Time   NA 140 06/04/2017 0523   NA 140 04/18/2017 0941   K 3.5 06/04/2017 0523   CL 105 06/04/2017 0523   CO2 25 06/04/2017 0523   GLUCOSE 91 06/04/2017 0523   BUN 5 (L) 06/04/2017 0523   BUN 13 04/18/2017 0941   CREATININE 0.93 06/04/2017 0523   CREATININE 0.84 07/08/2014 0927   CALCIUM 8.5 (L) 06/04/2017 0523   PROT 5.9 (L) 06/04/2017 0523   PROT 6.7 04/18/2017 0941   ALBUMIN 2.9 (L) 06/04/2017 0523   ALBUMIN 4.4 04/18/2017 0941   AST 14 (L) 06/04/2017 0523   ALT 11 (L) 06/04/2017 0523   ALKPHOS 55 06/04/2017 0523   BILITOT 1.2 06/04/2017 0523   BILITOT 0.6 04/18/2017 0941   GFRNONAA >60 06/04/2017 0523   GFRAA >60 06/04/2017 0523   Lipase     Component Value Date/Time   LIPASE 20 06/01/2017 1548    Studies/Results: No results found.    Jerre SimonJessica L Brien Lowe ,  [facility] 06/05/2017, 8:54 AM  Pager: [phone] Mon-Wed, Friday 7:00am-4:30pm Thurs 7am-11:30am  Consults: [phone]

## 2017-06-05 NOTE — Progress Notes (Signed)
Discharge instructions given to patient, patient verbalized understanding.Patient left unit in stable condition via wheelchair with nursing staff.

## 2017-07-18 DIAGNOSIS — K5732 Diverticulitis of large intestine without perforation or abscess without bleeding: Secondary | ICD-10-CM | POA: Diagnosis not present

## 2017-07-19 ENCOUNTER — Other Ambulatory Visit: Payer: Self-pay | Admitting: General Surgery

## 2017-07-21 ENCOUNTER — Ambulatory Visit: Payer: Self-pay | Admitting: General Surgery

## 2017-08-17 ENCOUNTER — Emergency Department (HOSPITAL_COMMUNITY)
Admission: EM | Admit: 2017-08-17 | Discharge: 2017-08-17 | Disposition: A | Discharge status: Home / Self Care | Payer: BLUE CROSS/BLUE SHIELD | Attending: Emergency Medicine | Admitting: Emergency Medicine

## 2017-08-17 ENCOUNTER — Emergency Department (HOSPITAL_COMMUNITY): Payer: BLUE CROSS/BLUE SHIELD

## 2017-08-17 ENCOUNTER — Other Ambulatory Visit: Payer: Self-pay

## 2017-08-17 ENCOUNTER — Encounter (HOSPITAL_COMMUNITY): Payer: Self-pay | Admitting: Emergency Medicine

## 2017-08-17 DIAGNOSIS — J45909 Unspecified asthma, uncomplicated: Secondary | ICD-10-CM | POA: Diagnosis not present

## 2017-08-17 DIAGNOSIS — R1030 Lower abdominal pain, unspecified: Secondary | ICD-10-CM | POA: Diagnosis not present

## 2017-08-17 DIAGNOSIS — Z79899 Other long term (current) drug therapy: Secondary | ICD-10-CM | POA: Insufficient documentation

## 2017-08-17 DIAGNOSIS — R109 Unspecified abdominal pain: Secondary | ICD-10-CM | POA: Diagnosis not present

## 2017-08-17 LAB — CBC
HCT: 44.8 % (ref 39.0–52.0)
HEMOGLOBIN: 14.9 g/dL (ref 13.0–17.0)
MCH: 28.8 pg (ref 26.0–34.0)
MCHC: 33.3 g/dL (ref 30.0–36.0)
MCV: 86.7 fL (ref 78.0–100.0)
Platelets: 344 10*3/uL (ref 150–400)
RBC: 5.17 MIL/uL (ref 4.22–5.81)
RDW: 12.8 % (ref 11.5–15.5)
WBC: 7.5 10*3/uL (ref 4.0–10.5)

## 2017-08-17 LAB — COMPREHENSIVE METABOLIC PANEL
ALBUMIN: 4 g/dL (ref 3.5–5.0)
ALT: 15 U/L — AB (ref 17–63)
ANION GAP: 6 (ref 5–15)
AST: 22 U/L (ref 15–41)
Alkaline Phosphatase: 59 U/L (ref 38–126)
BUN: 9 mg/dL (ref 6–20)
CALCIUM: 9.1 mg/dL (ref 8.9–10.3)
CO2: 27 mmol/L (ref 22–32)
CREATININE: 0.92 mg/dL (ref 0.61–1.24)
Chloride: 104 mmol/L (ref 101–111)
GFR calc Af Amer: >60 mL/min (ref >60)
GFR calc non Af Amer: >60 mL/min (ref >60)
GLUCOSE: 96 mg/dL (ref 65–99)
Potassium: 3.8 mmol/L (ref 3.5–5.1)
SODIUM: 137 mmol/L (ref 135–145)
Total Bilirubin: 1.3 mg/dL — ABNORMAL HIGH (ref 0.3–1.2)
Total Protein: 6.6 g/dL (ref 6.5–8.1)

## 2017-08-17 LAB — URINALYSIS, ROUTINE W REFLEX MICROSCOPIC
BILIRUBIN URINE: NEGATIVE
GLUCOSE, UA: NEGATIVE mg/dL
HGB URINE DIPSTICK: NEGATIVE
Ketones, ur: NEGATIVE mg/dL
Leukocytes, UA: NEGATIVE
Nitrite: NEGATIVE
Protein, ur: NEGATIVE mg/dL
Specific Gravity, Urine: 1.001 — ABNORMAL LOW (ref 1.005–1.030)
pH: 7 (ref 5.0–8.0)

## 2017-08-17 LAB — LIPASE, BLOOD: Lipase: 25 U/L (ref 11–51)

## 2017-08-17 MED ORDER — SODIUM CHLORIDE 0.9 % IV BOLUS
1000.0000 mL | Freq: Once | INTRAVENOUS | Status: AC
  Administered 2017-08-17: 1000 mL via INTRAVENOUS

## 2017-08-17 MED ORDER — IOHEXOL 300 MG/ML  SOLN
100.0000 mL | Freq: Once | INTRAMUSCULAR | Status: AC | PRN
  Administered 2017-08-17: 100 mL via INTRAVENOUS

## 2017-08-17 NOTE — ED Triage Notes (Signed)
Patient complains of abdominal cramping x2 weeks, states dx of diverticulitis in March 2019, states this feels different. Also complains of belching, denies any other complaints at this time.

## 2017-08-17 NOTE — ED Provider Notes (Signed)
[facility] EMERGENCY DEPARTMENT Provider Note   CSN: [id number] Arrival date & time: 08/17/17  6295     History   Chief Complaint Chief Complaint  Patient presents with  . Abdominal Pain    HPI Jesse Houston is a 37 y.o. male with a past medical history of diverticulitis of the colon with perforation diagnosed March 2019, who presents to ED for evaluation of 1-1/2-week history of intermittent lower abdominal cramping.  Cannot recall any aggravating or alleviating factors.  Has not taken any medications to help with the pain.  States that this feels "less severe" than the symptoms he felt when he was diagnosed with diverticulitis earlier in the year.  He reports normal appetite and no changes to pain with food intake.  Had a normal bowel movement this morning.  Denies any nausea, vomiting, dysuria, hematuria, back pain, diarrhea, constipation, fevers, sick contacts with similar symptoms.  Reports occasional alcohol use.  Denies any other drug use.  Denies any prior abdominal surgeries.  HPI  Past Medical History:  Diagnosis Date  . Allergy   . Asthma   . Low testosterone     Patient Active Problem List   Diagnosis Date Noted  . Diverticulitis of colon with perforation 06/01/2017  . Fatigue 08/02/2011  . Low testosterone 08/02/2011    History reviewed. No pertinent surgical history.      Home Medications    Prior to Admission medications   Medication Sig Start Date End Date Taking? Authorizing Provider  Multiple Vitamin (MULTIVITAMIN) tablet Take 1 tablet by mouth daily.   Yes [provider]  Omega-3 Fatty Acids (FISH OIL) 1000 MG CAPS Take 2 capsules by mouth daily.   Yes [provider]  sildenafil (VIAGRA) 100 MG tablet Take 0.5-1 tablets (50-100 mg total) by mouth daily as needed for erectile dysfunction. Patient not taking: Reported on 08/14/2017 04/18/17   Georgina Quint, MD    Family History Family History  Problem  Relation Age of Onset  . Arthritis Mother   . Brain cancer Son   . Arthritis Maternal Grandmother   . Diabetes Maternal Grandfather   . Uterine cancer Paternal Grandmother   . Cirrhosis Paternal Grandfather     Social History Social History   Tobacco Use  . Smoking status: Never Smoker  . Smokeless tobacco: Never Used  Substance Use Topics  . Alcohol use: Yes    Alcohol/week: 1.0 - 1.5 oz    Types: 2 - 3 Standard drinks or equivalent per week  . Drug use: No     Allergies   Patient has no known allergies.   Review of Systems Review of Systems  Constitutional: Negative for appetite change, chills and fever.  HENT: Negative for ear pain, rhinorrhea, sneezing and sore throat.   Eyes: Negative for photophobia and visual disturbance.  Respiratory: Negative for cough, chest tightness, shortness of breath and wheezing.   Cardiovascular: Negative for chest pain and palpitations.  Gastrointestinal: Positive for abdominal pain. Negative for blood in stool, constipation, diarrhea, nausea and vomiting.  Genitourinary: Negative for dysuria, hematuria and urgency.  Musculoskeletal: Negative for myalgias.  Skin: Negative for rash.  Neurological: Negative for dizziness, weakness and light-headedness.     Physical Exam Updated Vital Signs BP 120/72   Pulse (!) 56   Temp 97.6 F (36.4 C) (Oral)   Resp 16   SpO2 97%   Physical Exam  Constitutional: He appears well-developed and well-nourished. No distress.  HENT:  Head: Normocephalic  and atraumatic.  Nose: Nose normal.  Eyes: Conjunctivae and EOM are normal. Left eye exhibits no discharge. No scleral icterus.  Neck: Normal range of motion. Neck supple.  Cardiovascular: Normal rate, regular rhythm, normal heart sounds and intact distal pulses. Exam reveals no gallop and no friction rub.  No murmur heard. Pulmonary/Chest: Effort normal and breath sounds normal. No respiratory distress.  Abdominal: Soft. Bowel sounds are  normal. He exhibits no distension. There is tenderness in the suprapubic area. There is no rebound and no guarding.  Musculoskeletal: Normal range of motion. He exhibits no edema.  Neurological: He is alert. He exhibits normal muscle tone. Coordination normal.  Skin: Skin is warm and dry. No rash noted.  Psychiatric: He has a normal mood and affect.  Nursing note and vitals reviewed.    ED Treatments / Results  Labs (all labs ordered are listed, but only abnormal results are displayed) Labs Reviewed  COMPREHENSIVE METABOLIC PANEL - Abnormal; Notable for the following components:      Result Value   ALT 15 (*)    Total Bilirubin 1.3 (*)    All other components within normal limits  URINALYSIS, ROUTINE W REFLEX MICROSCOPIC - Abnormal; Notable for the following components:   Color, Urine COLORLESS (*)    Specific Gravity, Urine 1.001 (*)    All other components within normal limits  LIPASE, BLOOD  CBC    EKG None  Radiology Ct Abdomen Pelvis W Contrast  Result Date: 08/17/2017 CLINICAL DATA:  Abdominal cramping for 2 weeks. History of diverticulitis in March of 2019. EXAM: CT ABDOMEN AND PELVIS WITH CONTRAST TECHNIQUE: Multidetector CT imaging of the abdomen and pelvis was performed using the standard protocol following bolus administration of intravenous contrast. CONTRAST:  OMNIPAQUE IOHEXOL 300 MG/ML  SOLN COMPARISON:  CT abdomen dated 06/01/2017. FINDINGS: Lower chest: No acute abnormality. Hepatobiliary: No focal liver abnormality is seen. No gallstones, gallbladder wall thickening, or biliary dilatation. Pancreas: Unremarkable. No pancreatic ductal dilatation or surrounding inflammatory changes. Spleen: Normal in size without focal abnormality. Adrenals/Urinary Tract: Adrenal glands are unremarkable. Kidneys are normal, without renal calculi, focal lesion, or hydronephrosis. Bladder is unremarkable. Stomach/Bowel: Bowel is normal in caliber. No bowel wall thickening or  evidence of bowel wall inflammation. No evidence of diverticulitis. Appendix is normal. Stomach is unremarkable, partially decompressed. Vascular/Lymphatic: No significant vascular findings are present. No enlarged abdominal or pelvic lymph nodes. Reproductive: Prostate is unremarkable. Other: No free fluid or abscess collection. No free intraperitoneal air. Musculoskeletal: No acute or significant osseous finding. Small periumbilical abdominal wall hernia which contains fat only. Superficial soft tissues are otherwise unremarkable. IMPRESSION: No acute findings within the abdomen or pelvis. No bowel obstruction or evidence of bowel wall inflammation. No evidence of acute diverticulitis. No evidence of acute solid organ abnormality. No renal or ureteral calculi. Appendix is normal. Electronically Signed   By: Bary Richard M.D.   On: 08/17/2017 11:17    Procedures Procedures (including critical care time)  Medications Ordered in ED Medications  sodium chloride 0.9 % bolus 1,000 mL (0 mLs Intravenous Stopped 08/17/17 1052)  iohexol (OMNIPAQUE) 300 MG/ML solution 100 mL (100 mLs Intravenous Contrast Given 08/17/17 1058)     Initial Impression / Assessment and Plan / ED Course  I have reviewed the triage vital signs and the nursing notes.  Pertinent labs & imaging results that were available during my care of the patient were reviewed by me and considered in my medical decision making (see chart for  details).     Patient presents to ED for evaluation of 1-1/2-week history of lower abdominal cramping.  Patient was diagnosed with diverticulitis with perforation in March 2019.  He had a 5-day hospital stay at that time.  He was discharged home with antibiotics.  He states that this pain feels "less severe" than the pain he felt at that time.  He denies any diarrhea, constipation, nausea, vomiting, fever, urinary symptoms.  He has not taken any medications to help with his symptoms.  On physical exam  patient is overall well-appearing.  He has mild suprapubic tenderness to palpation but states that it is "tolerable."  Vital signs are within normal limits.  He is afebrile.  Lab work including CBC, CMP, lipase and urinalysis all unremarkable.  However, due to patient's tenderness and history of perforated diverticulitis, CT scan was obtained.  This returned as negative for acute abnormality.  Patient remained stable.  Advised him to take over-the-counter pain medication as needed and to follow-up with the wellness center for further evaluation.  Advised to return to ED for any severe worsening symptoms.  Portions of this note were generated with Scientist, clinical (histocompatibility and immunogenetics)Dragon dictation software. Dictation errors may occur despite best attempts at proofreading.   Final Clinical Impressions(s) / ED Diagnoses   Final diagnoses:  Abdominal wall pain    ED Discharge Orders    None       Dietrich PatesKhatri, Zhara Gieske, PA-C 08/17/17 1138    Doug SouJacubowitz, Sam, MD 08/17/17 1202

## 2017-08-17 NOTE — ED Notes (Signed)
Pt oob to bathroom with steady gait. 

## 2017-11-06 ENCOUNTER — Ambulatory Visit (HOSPITAL_COMMUNITY)
Admission: RE | Admit: 2017-11-06 | Discharge: 2017-11-06 | Disposition: A | Discharge status: Home / Self Care | Payer: BLUE CROSS/BLUE SHIELD | Source: Ambulatory Visit | Attending: General Surgery | Admitting: General Surgery

## 2017-11-06 ENCOUNTER — Encounter (HOSPITAL_COMMUNITY)
Admission: RE | Disposition: A | Discharge status: Home / Self Care | Payer: Self-pay | Source: Ambulatory Visit | Attending: General Surgery

## 2017-11-06 ENCOUNTER — Encounter (HOSPITAL_COMMUNITY): Payer: Self-pay | Admitting: *Deleted

## 2017-11-06 ENCOUNTER — Ambulatory Visit (HOSPITAL_COMMUNITY): Payer: BLUE CROSS/BLUE SHIELD | Admitting: Anesthesiology

## 2017-11-06 ENCOUNTER — Other Ambulatory Visit: Payer: Self-pay

## 2017-11-06 DIAGNOSIS — K5730 Diverticulosis of large intestine without perforation or abscess without bleeding: Secondary | ICD-10-CM | POA: Diagnosis not present

## 2017-11-06 DIAGNOSIS — K5732 Diverticulitis of large intestine without perforation or abscess without bleeding: Secondary | ICD-10-CM | POA: Insufficient documentation

## 2017-11-06 DIAGNOSIS — R933 Abnormal findings on diagnostic imaging of other parts of digestive tract: Secondary | ICD-10-CM | POA: Diagnosis not present

## 2017-11-06 DIAGNOSIS — J45909 Unspecified asthma, uncomplicated: Secondary | ICD-10-CM | POA: Insufficient documentation

## 2017-11-06 HISTORY — PX: COLONOSCOPY: SHX174

## 2017-11-06 HISTORY — PX: COLONOSCOPY: SHX5424

## 2017-11-06 SURGERY — COLONOSCOPY
Anesthesia: Monitor Anesthesia Care

## 2017-11-06 MED ORDER — PROPOFOL 500 MG/50ML IV EMUL
INTRAVENOUS | Status: DC | PRN
  Administered 2017-11-06: 140 ug/kg/min via INTRAVENOUS

## 2017-11-06 MED ORDER — ONDANSETRON HCL 4 MG/2ML IJ SOLN
INTRAMUSCULAR | Status: DC | PRN
  Administered 2017-11-06: 4 mg via INTRAVENOUS

## 2017-11-06 MED ORDER — LACTATED RINGERS IV SOLN
INTRAVENOUS | Status: DC
  Administered 2017-11-06: 08:00:00 via INTRAVENOUS

## 2017-11-06 MED ORDER — LIDOCAINE 2% (20 MG/ML) 5 ML SYRINGE
INTRAMUSCULAR | Status: DC | PRN
  Administered 2017-11-06: 100 mg via INTRAVENOUS

## 2017-11-06 MED ORDER — PROPOFOL 10 MG/ML IV BOLUS
INTRAVENOUS | Status: DC | PRN
  Administered 2017-11-06 (×5): 20 mg via INTRAVENOUS

## 2017-11-06 NOTE — Anesthesia Procedure Notes (Signed)
Date/Time: 11/06/2017 8:23 AM Performed by: Florene Routeeardon, Hend Mccarrell L, CRNA Oxygen Delivery Method: Simple face mask

## 2017-11-06 NOTE — Anesthesia Postprocedure Evaluation (Signed)
Anesthesia Post Note  Patient: Jesse Houston  Procedure(s) Performed: COLONOSCOPY (N/A )     Patient location during evaluation: PACU Anesthesia Type: MAC Level of consciousness: awake and alert Pain management: pain level controlled Vital Signs Assessment: post-procedure vital signs reviewed and stable Respiratory status: spontaneous breathing, nonlabored ventilation, respiratory function stable and patient connected to nasal cannula oxygen Cardiovascular status: stable and blood pressure returned to baseline Postop Assessment: no apparent nausea or vomiting Anesthetic complications: no    Last Vitals:  Vitals:   11/06/17 0905 11/06/17 0910  BP: (!) 102/59 103/63  Pulse: 60 (!) 55  Resp: 13 14  Temp:    SpO2: 100% 100%    Last Pain:  Vitals:   11/06/17 0900  TempSrc:   PainSc: 0-No pain                 Petrina Melby DAVID

## 2017-11-06 NOTE — Discharge Instructions (Signed)
Post Colonoscopy Instructions ° °1. DIET: Follow a light bland diet the first 24 hours after arrival home, such as soup, liquids, crackers, etc.  Be sure to include lots of fluids daily.  Avoid fast food or heavy meals as your are more likely to get nauseated.   °2. You may have some mild rectal bleeding for the first few days after the procedure.  This should get less and less with time.  Resume any blood thinners 2 days after your procedure unless directed otherwise by your physician. °3. Take your usually prescribed home medications unless otherwise directed. °a. If you have any pain, it is helpful to get up and walk around, as it is usually from excess gas. °b. If this is not helpful, you can take an over-the-counter pain medication.  Choose one of the following that works best for you: °i. Naproxen (Aleve, etc)  Two 220mg tabs twice a day °ii. Ibuprofen (Advil, etc) Three 200mg tabs four times a day (every meal & bedtime) °iii. If you still have pain after using one of these, please call the office °4. It is normal to not have a bowel movement for 2-3 days after colonoscopy.   ° °5. ACTIVITIES as tolerated:   °6. You may resume regular (light) daily activities beginning the next day--such as daily self-care, walking, climbing stairs--gradually increasing activities as tolerated.  ° ° °WHEN TO CALL US [phone]: °1. Fever over 101.5 F (38.5 C)  °2. Severe abdominal or chest pain  °3. Large amount of rectal bleeding, passing multiple blood clots  °4. Dizziness or shortness of breath °5. Increasing nausea or vomiting ° ° The clinic staff is available to answer your questions during regular business hours (8:30am-5pm).  Please don’t hesitate to call and ask to speak to one of our nurses for clinical concerns.  ° If you have a medical emergency, go to the nearest emergency room or call 911. ° A surgeon from [facility] is always on call at the hospitals ° ° °[facility] °[address], [address], [place], [place]  [address] ? °MAIN: [phone] ? TOLL FREE: [phone] ?  °FAX [phone] °[email or web address] ° ° °

## 2017-11-06 NOTE — Anesthesia Preprocedure Evaluation (Signed)
Anesthesia Evaluation  Patient identified by MRN, date of birth, ID band Patient awake    Reviewed: Allergy & Precautions, NPO status , Patient's Chart, lab work & pertinent test results  Airway Mallampati: I  TM Distance: >3 FB Neck ROM: Full    Dental   Pulmonary    Pulmonary exam normal        Cardiovascular Normal cardiovascular exam     Neuro/Psych    GI/Hepatic   Endo/Other    Renal/GU      Musculoskeletal   Abdominal   Peds  Hematology   Anesthesia Other Findings   Reproductive/Obstetrics                             Anesthesia Physical Anesthesia Plan  ASA: II  Anesthesia Plan: MAC   Post-op Pain Management:    Induction: Intravenous  PONV Risk Score and Plan: 1 and Ondansetron and Treatment may vary due to age or medical condition  Airway Management Planned: Simple Face Mask  Additional Equipment:   Intra-op Plan:   Post-operative Plan:   Informed Consent: I have reviewed the patients History and Physical, chart, labs and discussed the procedure including the risks, benefits and alternatives for the proposed anesthesia with the patient or authorized representative who has indicated his/her understanding and acceptance.     Plan Discussed with: CRNA and Surgeon  Anesthesia Plan Comments:         Anesthesia Quick Evaluation

## 2017-11-06 NOTE — Transfer of Care (Signed)
Immediate Anesthesia Transfer of Care Note  Patient: Jesse Houston  Procedure(s) Performed: COLONOSCOPY (N/A )  Patient Location: Endoscopy Unit  Anesthesia Type:MAC  Level of Consciousness: awake  Airway & Oxygen Therapy: Patient Spontanous Breathing  Post-op Assessment: Report given to RN and Post -op Vital signs reviewed and stable  Post vital signs: Reviewed and stable  Last Vitals:  Vitals Value Taken Time  BP    Temp    Pulse 55 11/06/2017  8:52 AM  Resp 12 11/06/2017  8:52 AM  SpO2 97 % 11/06/2017  8:52 AM  Vitals shown include unvalidated device data.  Last Pain:  Vitals:   11/06/17 0710  TempSrc: Oral  PainSc: 0-No pain         Complications: No apparent anesthesia complications

## 2017-11-06 NOTE — Op Note (Signed)
[facility] Patient Name: Jesse Houston Procedure Date: 11/06/2017 MRN: [id number] Attending MD: Romie Levee , MD Date of Birth: 03/28/1980 CSN: [id number] Age: 37 Admit Type: Outpatient Procedure:                Colonoscopy Indications:              Abnormal CT of the GI tract, Diverticulitis Providers:                Romie Levee, MD, Roselie Awkward, RN, Verita Schneiders,                            Technician, Leroy Libman, CRNA Referring MD:              Medicines:                Propofol per Anesthesia Complications:            No immediate complications. Estimated Blood Loss:     Estimated blood loss: none. Procedure:                Pre-Anesthesia Assessment:                           - Prior to the procedure, a History and Physical                            was performed, and patient medications and                            allergies were reviewed. The patient's tolerance of                            previous anesthesia was also reviewed. The risks                            and benefits of the procedure and the sedation                            options and risks were discussed with the patient.                            All questions were answered, and informed consent                            was obtained. Prior Anticoagulants: The patient has                            taken no previous anticoagulant or antiplatelet                            agents. ASA Grade Assessment: II - A patient with                            mild systemic disease. After reviewing the risks  and benefits, the patient was deemed in                            satisfactory condition to undergo the procedure.                           After obtaining informed consent, the colonoscope                            was passed under direct vision. Throughout the                            procedure, the patient's blood pressure, pulse, and        oxygen saturations were monitored continuously. The                            CF-HQ190L ([id number]) Olympus adult colonoscope was                            introduced through the anus and advanced to the the                            cecum, identified by appendiceal orifice and                            ileocecal valve. The colonoscopy was performed                            without difficulty. The patient tolerated the                            procedure well. The quality of the bowel                            preparation was adequate to identify polyps. Scope                            withdrawal time was 12 minutes. The ileocecal valve                            and the appendiceal orifice were photographed. Scope In: 8:28:31 AM Scope Out: 8:47:35 AM Scope Withdrawal Time: 0 hours 12 minutes 4 seconds  Total Procedure Duration: 0 hours 19 minutes 4 seconds  Findings:      The perianal and digital rectal examinations were normal.      The entire examined colon appeared normal on direct and retroflexion       views. Impression:               - The entire examined colon is normal on direct and                            retroflexion views.                           -  No specimens collected.                           - Mild diverticulosis in the sigmoid colon. There                            was no evidence of diverticular bleeding.                           - Mild diverticulosis in the ascending colon. Moderate Sedation:      Moderate (conscious) sedation was personally administered by an       anesthesia professional. The following parameters were monitored: oxygen       saturation, heart rate, blood pressure, and response to care. Recommendation:           - Discharge patient to home (ambulatory).                           - Resume previous diet.                           - Continue present medications.                           - Repeat colonoscopy at age 37 for screening                             purposes.                           - Return to referring physician in 3 weeks. Procedure Code(s):        --- Professional ---                           [phone] [id number], Colonoscopy, flexible; diagnostic, including                            collection of specimen(s) by brushing or washing,                            when performed (separate procedure) Diagnosis Code(s):        --- Professional ---                           U04.54K57.32, Diverticulitis of large intestine without                            perforation or abscess without bleeding                           K57.30, Diverticulosis of large intestine without                            perforation or abscess without bleeding                           R93.3, Abnormal findings on diagnostic  imaging of                            other parts of digestive tract CPT copyright 2017 American Medical Association. All rights reserved. The codes documented in this report are preliminary and upon coder review may  be revised to meet current compliance requirements. Romie Levee, MD Romie Levee, MD 11/06/2017 8:54:44 AM This report has been signed electronically. Number of Addenda: 0

## 2017-11-06 NOTE — H&P (Signed)
  37 yo male s/p perforated diverticulitis in March 2019.  He has never had a colonoscopy.  He has now healed from his perforation.        Past Medical History:  Diagnosis Date  . Allergy   . Asthma   . Low testosterone     History reviewed. No pertinent surgical history.       Family History  Problem Relation Age of Onset  . Arthritis Mother   . Brain cancer Son   . Arthritis Maternal Grandmother   . Diabetes Maternal Grandfather   . Uterine cancer Paternal Grandmother   . Cirrhosis Paternal Grandfather    Social History:  reports that  has never smoked. he has never used smokeless tobacco. He reports that he drinks about 1.0 - 1.5 oz of alcohol per week. He reports that he does not use drugs.  Allergies: No Known Allergies   Review of Systems  Constitutional: Negative for chills and fever.  HENT: Negative for hearing loss.   Eyes: Negative for blurred vision and double vision.  Respiratory: Negative for cough and hemoptysis.   Cardiovascular: Negative for chest pain and palpitations.  Gastrointestinal: Neg for abdominal pain and nausea. Negative for vomiting.  Genitourinary: Negative for dysuria and urgency.  Musculoskeletal: Negative for myalgias and neck pain.  Skin: Negative for itching and rash.  Neurological: Negative for dizziness, tingling and headaches.  Endo/Heme/Allergies: Does not bruise/bleed easily.  Psychiatric/Behavioral: Negative for depression and suicidal ideas.    BP 118/67   Pulse 61   Temp 97.9 F (36.6 C) (Oral)   Resp 15   Ht 6\' 3"  (1.905 m)   Wt 78.5 kg   SpO2 100%   BMI 21.62 kg/m   Physical Exam   Constitutional: He is oriented to person, place, and time. He appears well-developed and well-nourished.  HENT:  Head: Normocephalic and atraumatic.  Eyes: Conjunctivae and EOM are normal. Pupils are equal, round, and reactive to light.  Neck: Normal range of motion. Neck supple.  Cardiovascular: Normal rate and  regular rhythm.  Respiratory: Effort normal and breath sounds normal.  GI: Soft. Bowel sounds are normal. He exhibits no distension. There is no tenderness to palpation.  There is no rigidity and no guarding.  Musculoskeletal: Normal range of motion.  Neurological: He is alert and oriented to person, place, and time.  Skin: Skin is warm and dry.  Psychiatric: He has a normal mood and affect. His behavior is normal.     Assessment/Plan 37 yo male s/p with Henchey III perforated diverticulitis with leukocytosis.  He is here today for colonoscopy.  Risks including bleeding, pain, perforation and missed pathology have been explained and he agrees to proceed.  All questions were answered.

## 2017-11-07 ENCOUNTER — Encounter (HOSPITAL_COMMUNITY): Payer: Self-pay | Admitting: General Surgery

## 2018-01-10 IMAGING — CT CT NECK W/ CM
3 of 4 series · 13 of 33 positions shown, 16 images · IV contrast (APPLIED)
Comparison: None.

CLINICAL DATA: 35-year-old male with dysphagia and sore throat for
4 days

EXAM:
CT NECK WITH CONTRAST
TECHNIQUE: Multidetector CT imaging of the neck was performed using the
standard protocol following the bolus administration of intravenous
contrast.
CONTRAST:  75mL RLTQWW-DOO IOPAMIDOL (RLTQWW-DOO) INJECTION 61%

[Series 6: coronal st · coronal · 0.44mm/px · 3 of 128 slices shown]
[im 26/128  bone]
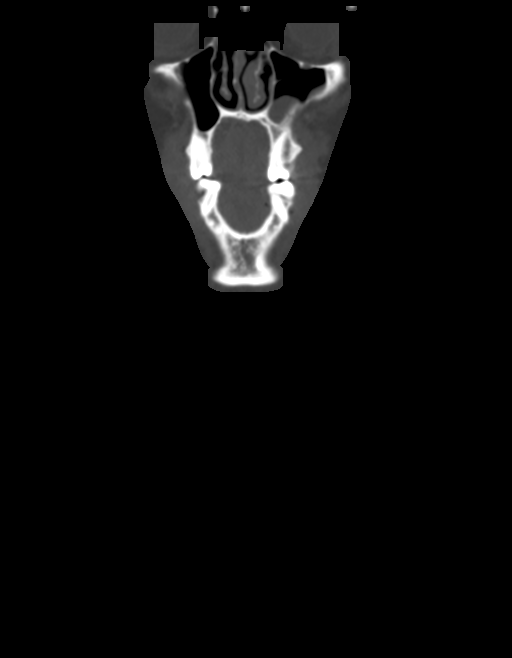
[im 51/128  bone]
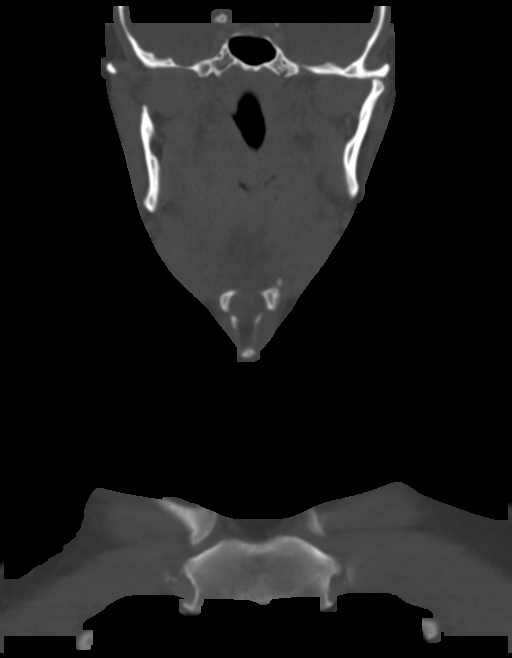
[im 77/128  bone]
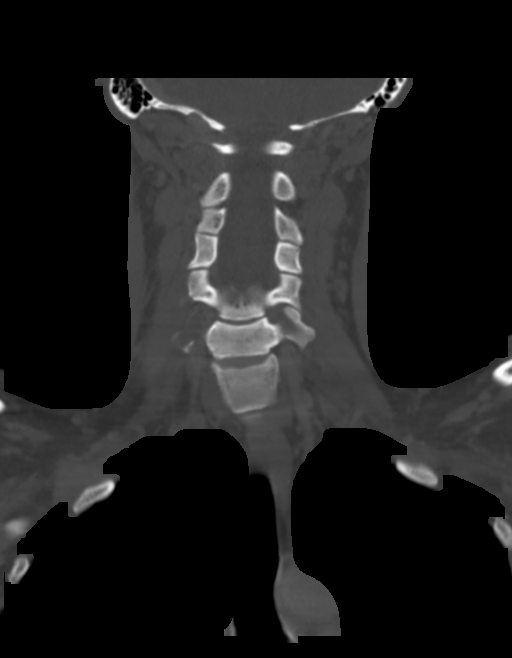

[Series 7: sagittal st · sagittal · 0.51mm/px · 5 of 101 slices shown, 6 images]
[im 34/101  bone]
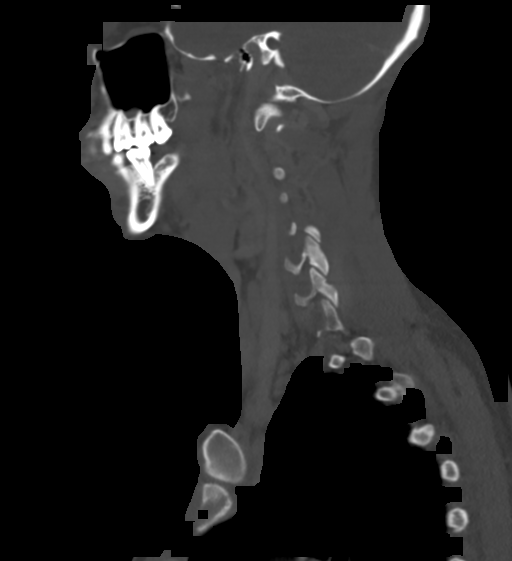
[im 42/101  bone]
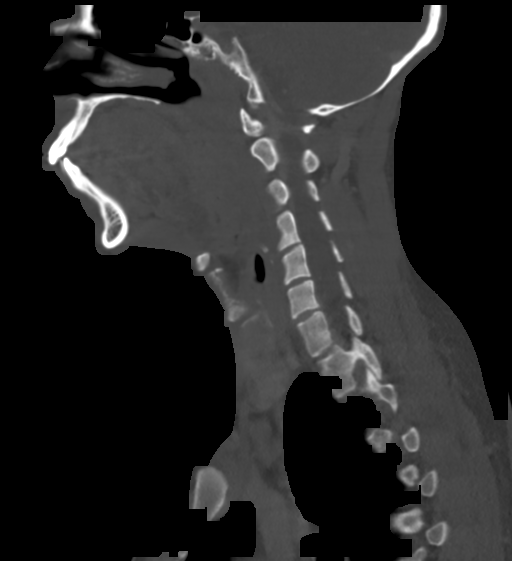
[im 51/101  soft-tissue]
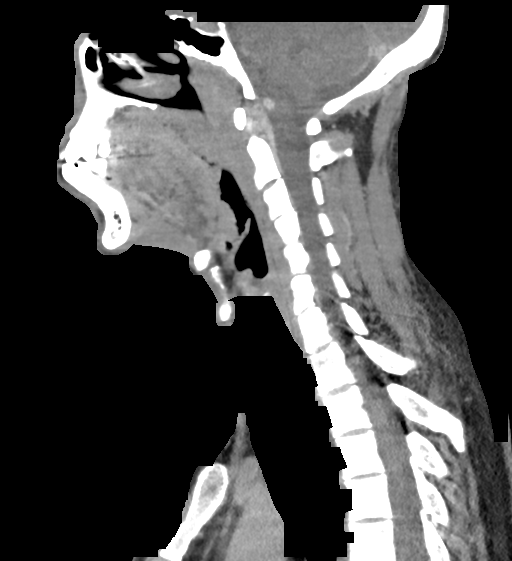
[im 51/101  bone]
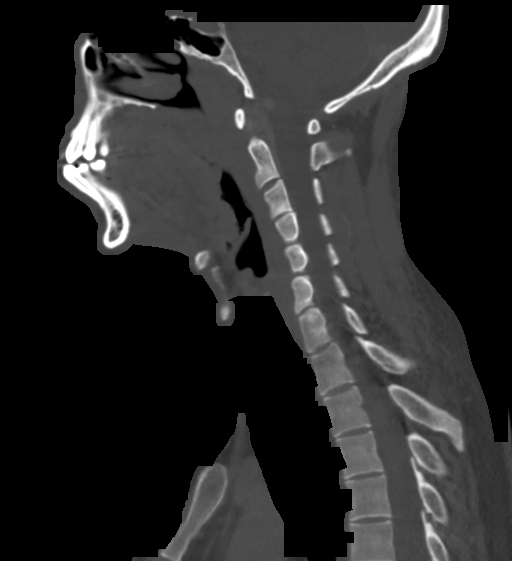
[im 59/101  bone]
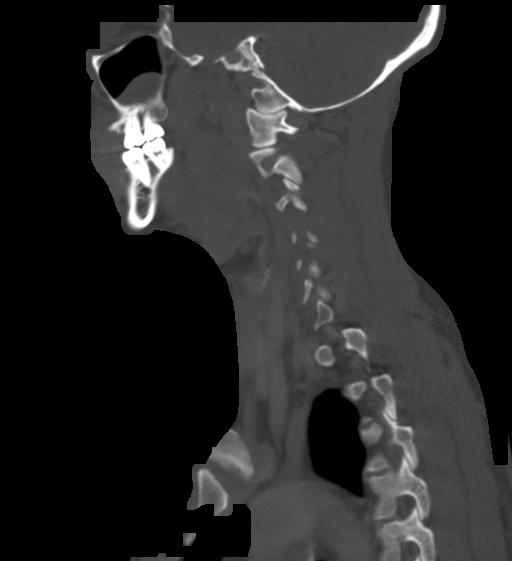
[im 67/101  bone]
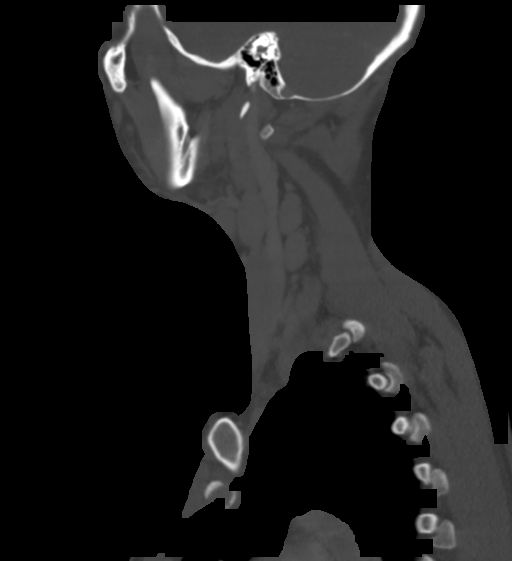

[Series 8: orthogonal st · axial · 0.39mm/px · z∈[-373,-169]mm · 5 of 156 slices shown, 7 images]
[im 26/156  soft-tissue]
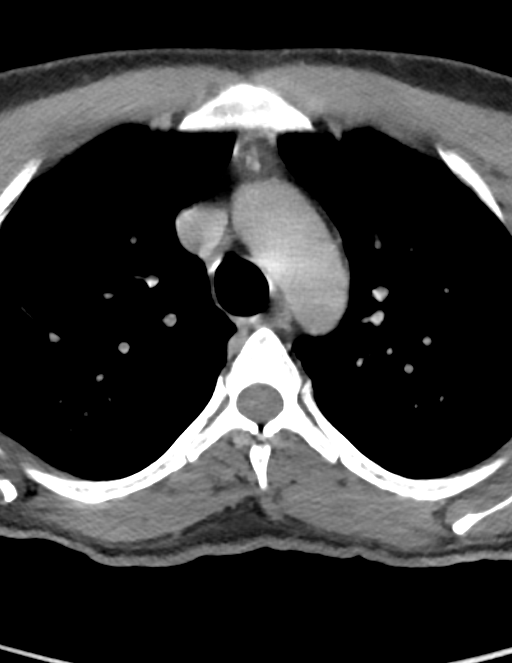
[im 26/156  bone]
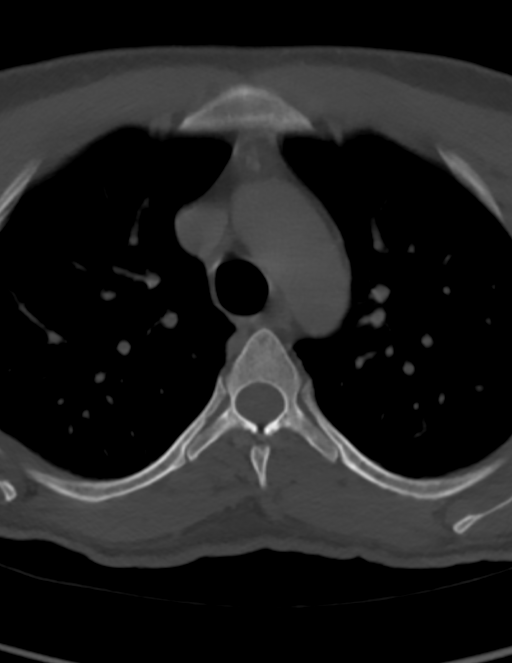
[im 52/156  bone]
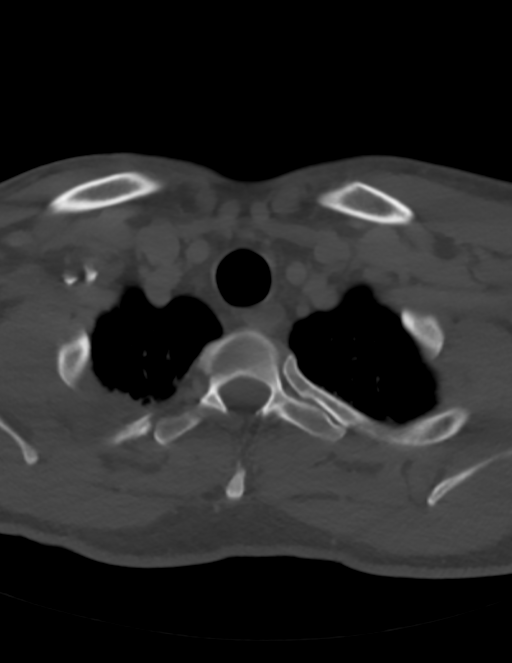
[im 78/156  bone]
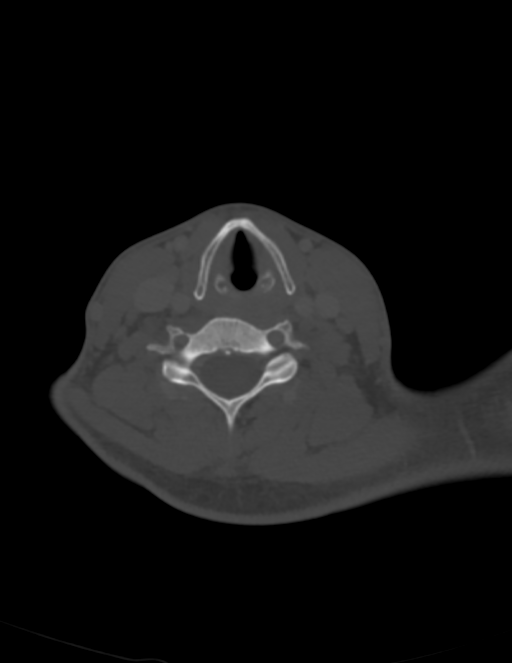
[im 104/156  bone]
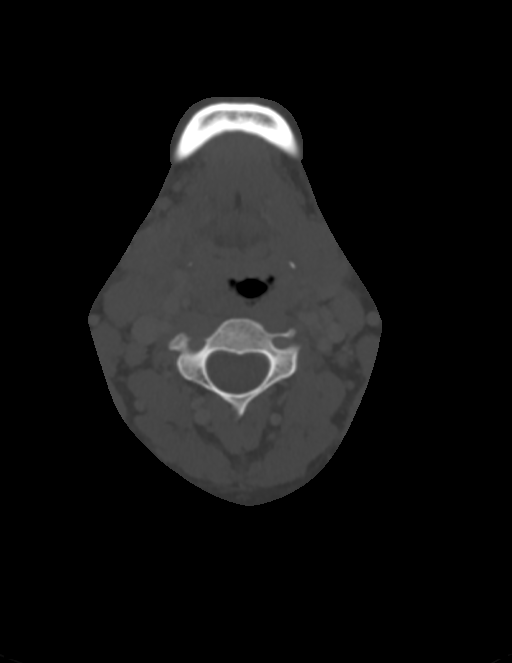
[im 130/156  soft-tissue]
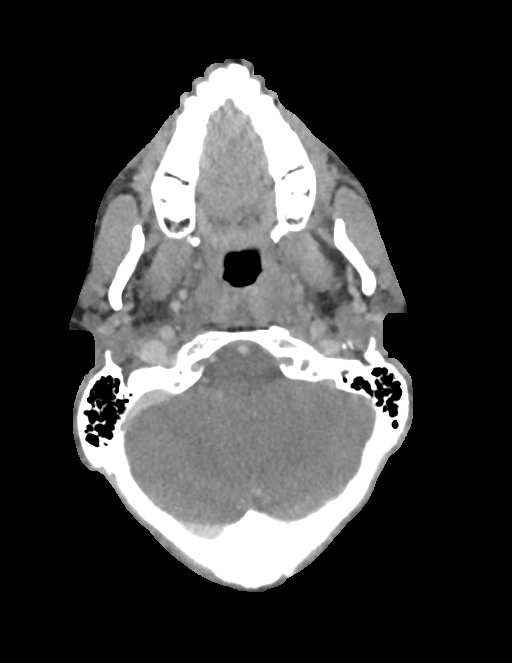
[im 130/156  bone]
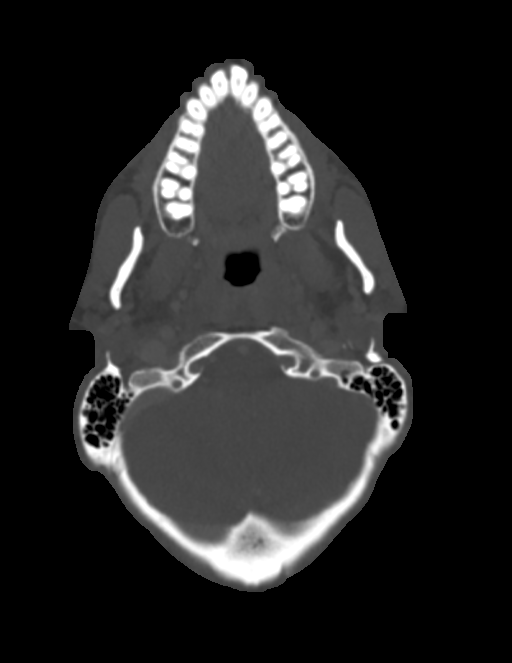

[13 of 33 positions shown; findings below may reference images not displayed]

FINDINGS: Pharynx and larynx: There is adenoid, palatine and lingual tonsil
hypertrophy. The degree of hypertrophy is greatest in the the
palatine tonsils. Within the right palatine tonsil, there is central
low attenuation, with density measuring slightly greater than fluid.
The airway remains widely patent. The appearance of the larynx is
normal. The epiglottis is normal. No retropharyngeal effusion or
other collection.

Salivary glands: The parotid and submandibular glands are normal. No
sialolithiasis or salivary ductal dilatation.

Thyroid: Normal

Lymph nodes: There are numerous bilateral enlarged lymph nodes,
measuring up to 1.6 cm a right level IIa and 1.2 cm the left level
IIa.

Vascular: Major cervical vessels are patent.

Limited intracranial: Normal

Visualized orbits: Normal

Mastoids and visualized paranasal sinuses: Retention cysts within
both maxillary sinuses.

Skeleton: Normal

Upper chest: Clear

Other: None
IMPRESSION: 1. Diffuse edematous enlargement of the palatine, lingual and
adenoid tonsils, consistent with acute pharyngitis.
2. Focal area of low attenuation within the right palatine tonsil is
most consistent with phlegmonous material and may indicate early
peritonsillar abscess formation; but there is no well-defined
abscess at this time.
3. Bilateral reactive cervical lymphadenopathy.
4. No airway compromise.

## 2018-03-06 ENCOUNTER — Encounter: Payer: Self-pay | Admitting: Gastroenterology

## 2018-04-03 ENCOUNTER — Ambulatory Visit: Payer: BLUE CROSS/BLUE SHIELD | Admitting: Gastroenterology

## 2018-04-25 DIAGNOSIS — Z0000 Encounter for general adult medical examination without abnormal findings: Secondary | ICD-10-CM | POA: Diagnosis not present

## 2018-05-05 ENCOUNTER — Other Ambulatory Visit (INDEPENDENT_AMBULATORY_CARE_PROVIDER_SITE_OTHER): Payer: BLUE CROSS/BLUE SHIELD

## 2018-05-05 ENCOUNTER — Encounter: Payer: Self-pay | Admitting: Gastroenterology

## 2018-05-05 ENCOUNTER — Ambulatory Visit: Payer: BLUE CROSS/BLUE SHIELD | Admitting: Gastroenterology

## 2018-05-05 VITALS — BP 90/64 | HR 88 | Ht 74.5 in | Wt 170.4 lb

## 2018-05-05 DIAGNOSIS — R140 Abdominal distension (gaseous): Secondary | ICD-10-CM | POA: Diagnosis not present

## 2018-05-05 DIAGNOSIS — Z8719 Personal history of other diseases of the digestive system: Secondary | ICD-10-CM

## 2018-05-05 DIAGNOSIS — R109 Unspecified abdominal pain: Secondary | ICD-10-CM

## 2018-05-05 DIAGNOSIS — R1084 Generalized abdominal pain: Secondary | ICD-10-CM

## 2018-05-05 LAB — CBC
HEMATOCRIT: 45.8 % (ref 39.0–52.0)
HEMOGLOBIN: 15.5 g/dL (ref 13.0–17.0)
MCHC: 33.9 g/dL (ref 30.0–36.0)
MCV: 87.2 fl (ref 78.0–100.0)
PLATELETS: 364 10*3/uL (ref 150.0–400.0)
RBC: 5.25 Mil/uL (ref 4.22–5.81)
RDW: 12.6 % (ref 11.5–15.5)
WBC: 10.1 10*3/uL (ref 4.0–10.5)

## 2018-05-05 LAB — HEPATIC FUNCTION PANEL
ALK PHOS: 85 U/L (ref 39–117)
ALT: 16 U/L (ref 0–53)
AST: 19 U/L (ref 0–37)
Albumin: 5 g/dL (ref 3.5–5.2)
BILIRUBIN DIRECT: 0.1 mg/dL (ref 0.0–0.3)
Total Bilirubin: 0.6 mg/dL (ref 0.2–1.2)
Total Protein: 7.8 g/dL (ref 6.0–8.3)

## 2018-05-05 LAB — HIGH SENSITIVITY CRP: CRP HIGH SENSITIVITY: 0.34 mg/L (ref 0.000–5.000)

## 2018-05-05 LAB — SEDIMENTATION RATE: Sed Rate: 8 mm/hr (ref 0–15)

## 2018-05-05 LAB — IGA: IGA: 213 mg/dL (ref 68–378)

## 2018-05-05 LAB — LIPASE: Lipase: 12 U/L (ref 11.0–59.0)

## 2018-05-05 LAB — AMYLASE: Amylase: 40 U/L (ref 27–131)

## 2018-05-05 NOTE — Progress Notes (Signed)
GASTROENTEROLOGY OUTPATIENT CLINIC VISIT   Primary Care Provider Patient, No Pcp Per No address on file None   Patient Profile: Jesse Houston is a 38 y.o. male with a pmh significant for diverticulosis complicated by diverticulitis perforation - medically managed, asthma, allergies.  The patient presents to the [facility] for an evaluation and management of problem(s) noted below:  Problem List 1. Generalized abdominal pain   2. Bloating symptom   3. History of colonic diverticulitis     History of Present Illness: This is the patient's first visit to the outpatient [facility].  In March of 2019 the patient presented with significant left lower quadrant abdominal pain and was found in the emergency department to have perforated diverticulitis.  He was treated medically with antibiotics and did not develop a phlegmon.  He had follow-up CT scans and subsequently 1 underwent a colonoscopy in August 2019 which was unremarkable other than findings of diverticulosis.  Since that period in time he did well until he was then trip in [place] in November.  He has since developed symptoms of increased burping and belching.  He has had the development of brain fogginess.  He has had abdominal pain that generalizes throughout his abdomen though mostly in his upper abdomen.  The discomfort is significantly different from his prior diverticulitis.  He tried a diet that was mostly gluten-free and he had abatement of his symptoms however he began to lose more weight.  As a result of that he transitioned off of that once he started eating his normal diet again he developed symptoms again.  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  He wonders if stress could be playing a role with things as well.  He has returned back to a gluten-free diet and has had improvement and is maintaining that currently.  He  is never had an additional work-up other than what has been done for his diverticulitis because he is never had issues before.  The patient takes nonsteroidals infrequently.  He has never had an upper endoscopy.  Since he is been on a gluten-free diet his stools have been a bit looser sometimes diarrhea like.  GI Review of Systems Positive as above including pyrosis (very infrequently), nausea, bloating Negative for dysphagia, odynophagia, vomiting, melena, hematochezia  Review of Systems General: Describes weight loss previously though things have stabilized; denies fevers/chills HEENT: Denies oral lesions Cardiovascular: Denies chest pain Pulmonary: Denies shortness of breath Gastroenterological: See HPI Genitourinary: Denies darkened urine Hematological: Denies easy bruising/bleeding Endocrine: Denies temperature intolerance Dermatological: Denies jaundice Psychological: Mood is stable Musculoskeletal: Positive for chronic arthralgias but denies new arthralgias   Medications Current Outpatient Medications  Medication Sig Dispense Refill  . Multiple Vitamin (MULTIVITAMIN) tablet Take 1 tablet by mouth daily.    . Omega-3 Fatty Acids (FISH OIL) 1000 MG CAPS Take 2 capsules by mouth daily.     No current facility-administered medications for this visit.     Allergies No Known Allergies  Histories Past Medical History:  Diagnosis Date  . Allergy   . Asthma   . Diverticulosis   . Low testosterone    Past Surgical History:  Procedure Laterality Date  . COLONOSCOPY N/A 11/06/2017   Procedure: COLONOSCOPY;  Surgeon: Romie Levee, MD;  Location: [facility];  Service: Endoscopy;  Laterality: N/A;  . VASECTOMY    . WISDOM TOOTH EXTRACTION     Social History  Socioeconomic History  . Marital status: Married    Spouse name: Not on file  . Number of children: 1  . Years of education: Not on file  . Highest education level: Not on file  Occupational History  .  Occupation: Superintendent  Social Needs  . Financial resource strain: Not on file  . Food insecurity:    Worry: Not on file    Inability: Not on file  . Transportation needs:    Medical: Not on file    Non-medical: Not on file  Tobacco Use  . Smoking status: Never Smoker  . Smokeless tobacco: Never Used  Substance and Sexual Activity  . Alcohol use: Yes    Alcohol/week: 2.0 - 3.0 standard drinks    Types: 2 - 3 Standard drinks or equivalent per week    Comment: social   . Drug use: No  . Sexual activity: Yes    Birth control/protection: Condom  Lifestyle  . Physical activity:    Days per week: Not on file    Minutes per session: Not on file  . Stress: Not on file  Relationships  . Social connections:    Talks on phone: Not on file    Gets together: Not on file    Attends religious service: Not on file    Active member of club or organization: Not on file    Attends meetings of clubs or organizations: Not on file    Relationship status: Not on file  . Intimate partner violence:    Fear of current or ex partner: Not on file    Emotionally abused: Not on file    Physically abused: Not on file    Forced sexual activity: Not on file  Other Topics Concern  . Not on file  Social History Narrative  . Not on file   Family History  Problem Relation Age of Onset  . Arthritis Mother   . Brain cancer Son   . Arthritis Maternal Grandmother   . Diabetes Maternal Grandfather   . Uterine cancer Paternal Grandmother   . Cirrhosis Paternal Grandfather   . Diabetes Maternal Aunt   . Colon cancer Neg Hx   . Esophageal cancer Neg Hx   . Inflammatory bowel disease Neg Hx   . Liver disease Neg Hx   . Pancreatic cancer Neg Hx   . Rectal cancer Neg Hx   . Stomach cancer Neg Hx    I have reviewed his medical, social, and family history in detail and updated the electronic medical record as necessary.    PHYSICAL EXAMINATION  BP 90/64 (BP Location: Left Arm, Patient Position:  Sitting, Cuff Size: Normal)   Pulse 88   Ht 6' 2.5" (1.892 m) Comment: height measured without shoes  Wt 170 lb 7 oz (77.3 kg)   BMI 21.59 kg/m  Wt Readings from Last 3 Encounters:  05/05/18 170 lb 7 oz (77.3 kg)  11/06/17 173 lb (78.5 kg)  06/01/17 179 lb 0.2 oz (81.2 kg)  GEN: NAD, appears stated age, doesn't appear chronically ill PSYCH: Cooperative, without pressured speech EYE: Conjunctivae pink, sclerae anicteric ENT: MMM, without oral ulcers, no erythema or exudates noted NECK: Supple CV: RR without R/Gs  RESP: CTAB posteriorly, without wheezing GI: NABS, soft, tenderness to palpation in the midepigastrium and subxiphoid and right upper quadrant and left upper quadrant regions, without guarding or rebound, nondistended, hepatosplenomegaly is not appreciated MSK/EXT: No lower extremity edema SKIN: No jaundice NEURO:  Alert & Oriented x  3, no focal deficits   REVIEW OF DATA  I reviewed the following data at the time of this encounter:  GI Procedures and Studies  10/2017 Colonoscopy - The entire examined colon is normal on direct and retroflexion views. - No specimens collected. - Mild diverticulosis in the sigmoid colon. There was no evidence of diverticular bleeding. - Mild diverticulosis in the ascending colon.  Laboratory Studies  Reviewed in epic  Imaging Studies  3/19 CTAP IMPRESSION: 1. Findings are compatible with an acute diverticulitis of the proximal sigmoid colon, with evidence of perforation demonstrated by small volume of pneumoperitoneum throughout the peritoneal cavity. Surgical consultation is strongly recommended.  6/19 CTAP IMPRESSION: No acute findings within the abdomen or pelvis. No bowel obstruction or evidence of bowel wall inflammation. No evidence of acute diverticulitis. No evidence of acute solid organ abnormality. No renal or ureteral calculi. Appendix is normal.   ASSESSMENT  Mr. Oguinn is a 38 y.o. male with a pmh significant for  diverticulosis complicated by diverticulitis perforation - medically managed, asthma, allergies.  The patient is seen today for evaluation and management of:  1. Generalized abdominal pain   2. Bloating symptom   3. History of colonic diverticulitis    This is a hemodynamically stable patient who is doing with an unexplained abdominal discomfort as well as bloating sensation.  His symptoms do not sound like typical dyspepsia however he may be developing or have some issues of this.  He has some symptoms that would be concerning for the possibility of underlying celiac disease or even small intestine bacterial overgrowth.  While he would have SIBO would not be completely clear as he has not had any significant surgical interventions.  We will begin a further work-up with laboratories as outlined below.  I also want to go ahead and get an H. pylori stool antigen in a few weeks once he is been off all acid reducing medications for period of time.  We also plan on obtaining a SIBO breath test.  I will put him on a PPI trial for approximately 6 to 8 weeks and if he has persistent issues then we should proceed with an upper endoscopy.  If his bowel symptoms of looser stools continue we may have to do a flexible sigmoidoscopy to obtain biopsies to rule out microscopic/collagenous colitis.  We had a long discussion about a gluten-free diet and the implications of being on that if a person does not have celiac disease.  For now he is trying to maintain that type of diet and thinks that it is helping him.  I suspect that his stress level is causing significant abdominal upset from a functional perspective as well and hopefully as the [facility] project comes to an end that things will stabilize and help him be in a better place overall.  All patient questions were answered, to the best of my ability, and the patient agrees to the aforementioned plan of action with follow-up as indicated.   PLAN  Laboratories as  outlined below H. pylori stool antigen SIBO breath testing After these tests have been given the patient may initiate PPI 40 mg twice daily for the next 6 to 8 weeks Symptoms persistent or not found based on laboratories as outlined below then we will proceed with a diagnostic upper endoscopy which we will work on scheduling as of now Pancreatic elastase to evaluate for bloating symptoms as well   Orders Placed This Encounter  Procedures  . Helicobacter pylori special  antigen  . CBC  . Amylase  . Lipase  . Hepatic function panel  . Sedimentation rate  . CRP High sensitivity  . IgA  . Tissue transglutaminase, IgA  . Pancreatic Elastase, Fecal  . Ambulatory referral to Gastroenterology    New Prescriptions   No medications on file   Modified Medications   No medications on file    Planned Follow Up: No follow-ups on file.   Corliss ParishGabriel Mansouraty, MD [facility] Office # [phone]

## 2018-05-05 NOTE — Patient Instructions (Signed)
   If you are age 38 or younger, your body mass index should be between 19-25. Your Body mass index is 21.59 kg/m. If this is out of the aformentioned range listed, please consider follow up with your Primary Care Provider.                                             DO SIBO test after you have been off of PPI for 2 weeks.   You have been given a testing kit to check for small intestine bacterial overgrowth (SIBO) which is completed by a company named [employer]. Make sure to return your test in the mail using the return mailing label given you along with the kit. Your demographic and insurance information have already been sent to the company and they should be in contact with you over the next week regarding this test. Please keep in mind that you will be getting a call from phone number [phone] or a similar number. If you do not hear from them within this time frame, please call our office at [phone].   Also you will need to submit H.Pylor Stool Test/ Pancreatic Fecal Elastase  in 2 weeks after you have been off of PPI for 2 weeks.   You have been scheduled for an endoscopy. Please follow written instructions given to you at your visit today. If you use inhalers (even only as needed), please bring them with you on the day of your procedure. Your physician has requested that you go to www.startemmi.com and enter the access code given to you at your visit today. This web site gives a general overview about your procedure. However, you should still follow specific instructions given to you by our office regarding your preparation for the procedure.  Your provider has requested that you go to the basement level for lab work before leaving today. Press "B" on the elevator. The lab is located at the first door on the left as you exit the elevator.   Start Nexium Over the counter twice daily after you have completed all test above.   Thank you for choosing me and [facility].  Dr. Rush Landmark

## 2018-05-06 ENCOUNTER — Encounter: Payer: Self-pay | Admitting: Gastroenterology

## 2018-05-06 LAB — TISSUE TRANSGLUTAMINASE, IGA: (tTG) Ab, IgA: 1 U/mL

## 2018-05-07 DIAGNOSIS — R1084 Generalized abdominal pain: Secondary | ICD-10-CM | POA: Insufficient documentation

## 2018-05-07 DIAGNOSIS — R140 Abdominal distension (gaseous): Secondary | ICD-10-CM | POA: Insufficient documentation

## 2018-05-07 DIAGNOSIS — Z8719 Personal history of other diseases of the digestive system: Secondary | ICD-10-CM | POA: Insufficient documentation

## 2018-05-18 ENCOUNTER — Other Ambulatory Visit: Payer: BLUE CROSS/BLUE SHIELD

## 2018-05-18 DIAGNOSIS — R1084 Generalized abdominal pain: Secondary | ICD-10-CM | POA: Diagnosis not present

## 2018-05-18 DIAGNOSIS — Z8719 Personal history of other diseases of the digestive system: Secondary | ICD-10-CM

## 2018-05-27 ENCOUNTER — Encounter: Payer: Self-pay | Admitting: Gastroenterology

## 2018-05-27 DIAGNOSIS — R109 Unspecified abdominal pain: Secondary | ICD-10-CM | POA: Diagnosis not present

## 2018-05-27 DIAGNOSIS — K5792 Diverticulitis of intestine, part unspecified, without perforation or abscess without bleeding: Secondary | ICD-10-CM | POA: Diagnosis not present

## 2018-05-27 LAB — HELICOBACTER PYLORI  SPECIAL ANTIGEN
MICRO NUMBER:: 263555
SPECIMEN QUALITY: ADEQUATE

## 2018-05-27 LAB — PANCREATIC ELASTASE, FECAL

## 2018-06-10 ENCOUNTER — Encounter: Payer: Self-pay | Admitting: Gastroenterology

## 2018-06-10 NOTE — Progress Notes (Signed)
Jesse Houston or Jesse Houston please let the patient know that we will review Jesse Houston breath test results at time of Jesse Houston upcoming procedure.  For documentation purposes, the patient's SIBO breath test has returned. He has an increase in both hydrogen as well as methane although there is not a double peak. This is interpreted as suspicious for bacterial overgrowth. Patient has an upcoming upper endoscopy to rule out PUD and to see how Jesse Houston symptoms are doing overall. We will discuss with him briefly the results of this and consider further therapeutic management of this based on symptoms that are occurring or not. We will have this record scanned into the chart.   Corliss Parish, MD [facility] Office # [phone]

## 2018-06-12 ENCOUNTER — Telehealth: Payer: Self-pay

## 2018-06-12 NOTE — Telephone Encounter (Signed)
Jesse Houston please let the patient know that we will discuss his results in more detail at time of his procedure. Thank you.  GM   Routing comment     Mansouraty, Netty Starring., MD 2 days ago      [name] or [name] please let the patient know that we will review his breath test results at time of his upcoming procedure.  For documentation purposes, the patient's SIBO breath test has returned. He has an increase in both hydrogen as well as methane although there is not a double peak. This is interpreted as suspicious for bacterial overgrowth. Patient has an upcoming upper endoscopy to rule out PUD and to see how his symptoms are doing overall. We will discuss with him briefly the results of this and consider further therapeutic management of this based on symptoms that are occurring or not. We will have this record scanned into the chart.   Corliss Parish, MD [facility] Office # [phone]     Pt informed that results will be discussed with him at time of procedure. Pt states that he understands.

## 2018-06-12 NOTE — Telephone Encounter (Signed)
Pt called stating that he was returning Dr. Elesa Hacker call. He would like another call if possible.

## 2018-06-18 ENCOUNTER — Encounter: Payer: BLUE CROSS/BLUE SHIELD | Admitting: Gastroenterology

## 2018-07-01 ENCOUNTER — Ambulatory Visit: Payer: BLUE CROSS/BLUE SHIELD | Admitting: Gastroenterology

## 2018-07-23 ENCOUNTER — Telehealth: Payer: Self-pay | Admitting: *Deleted

## 2018-07-23 NOTE — Telephone Encounter (Signed)
EGD scheduled for 08-13-18 at 1000.  New instructions mailed to pt.

## 2018-07-28 ENCOUNTER — Ambulatory Visit: Payer: BLUE CROSS/BLUE SHIELD | Admitting: Podiatry

## 2018-07-28 ENCOUNTER — Other Ambulatory Visit: Payer: Self-pay

## 2018-07-28 ENCOUNTER — Encounter: Payer: Self-pay | Admitting: Podiatry

## 2018-07-28 ENCOUNTER — Ambulatory Visit (INDEPENDENT_AMBULATORY_CARE_PROVIDER_SITE_OTHER): Payer: BLUE CROSS/BLUE SHIELD

## 2018-07-28 ENCOUNTER — Other Ambulatory Visit: Payer: Self-pay | Admitting: Podiatry

## 2018-07-28 VITALS — BP 102/74 | HR 80 | Temp 97.3°F | Resp 16

## 2018-07-28 DIAGNOSIS — M79671 Pain in right foot: Secondary | ICD-10-CM

## 2018-07-28 DIAGNOSIS — M216X9 Other acquired deformities of unspecified foot: Secondary | ICD-10-CM | POA: Diagnosis not present

## 2018-07-28 DIAGNOSIS — M722 Plantar fascial fibromatosis: Secondary | ICD-10-CM | POA: Diagnosis not present

## 2018-07-28 MED ORDER — MELOXICAM 15 MG PO TABS: 15.0000 mg | ORAL_TABLET | Freq: Every day | ORAL | 0 refills | Status: AC

## 2018-07-28 NOTE — Patient Instructions (Addendum)
Plantar Fasciitis (Heel Spur Syndrome) with Rehab The plantar fascia is a fibrous, ligament-like, soft-tissue structure that spans the bottom of the foot. Plantar fasciitis is a condition that causes pain in the foot due to inflammation of the tissue. SYMPTOMS   Pain and tenderness on the underneath side of the foot.  Pain that worsens with standing or walking. CAUSES  Plantar fasciitis is caused by irritation and injury to the plantar fascia on the underneath side of the foot. Common mechanisms of injury include:  Direct trauma to bottom of the foot.  Damage to a small nerve that runs under the foot where the main fascia attaches to the heel bone.  Stress placed on the plantar fascia due to bone spurs. RISK INCREASES WITH:   Activities that place stress on the plantar fascia (running, jumping, pivoting, or cutting).  Poor strength and flexibility.  Improperly fitted shoes.  Tight calf muscles.  Flat feet.  Failure to warm-up properly before activity.  Obesity. PREVENTION  Warm up and stretch properly before activity.  Allow for adequate recovery between workouts.  Maintain physical fitness:  Strength, flexibility, and endurance.  Cardiovascular fitness.  Maintain a health body weight.  Avoid stress on the plantar fascia.  Wear properly fitted shoes, including arch supports for individuals who have flat feet.  PROGNOSIS  If treated properly, then the symptoms of plantar fasciitis usually resolve without surgery. However, occasionally surgery is necessary.  RELATED COMPLICATIONS   Recurrent symptoms that may result in a chronic condition.  Problems of the lower back that are caused by compensating for the injury, such as limping.  Pain or weakness of the foot during push-off following surgery.  Chronic inflammation, scarring, and partial or complete fascia tear, occurring more often from repeated injections.  TREATMENT  Treatment initially involves the  use of ice and medication to help reduce pain and inflammation. The use of strengthening and stretching exercises may help reduce pain with activity, especially stretches of the Achilles tendon. These exercises may be performed at home or with a therapist. Your caregiver may recommend that you use heel cups of arch supports to help reduce stress on the plantar fascia. Occasionally, corticosteroid injections are given to reduce inflammation. If symptoms persist for greater than 6 months despite non-surgical (conservative), then surgery may be recommended.   MEDICATION   If pain medication is necessary, then nonsteroidal anti-inflammatory medications, such as aspirin and ibuprofen, or other minor pain relievers, such as acetaminophen, are often recommended.  Do not take pain medication within 7 days before surgery.  Prescription pain relievers may be given if deemed necessary by your caregiver. Use only as directed and only as much as you need.  Corticosteroid injections may be given by your caregiver. These injections should be reserved for the most serious cases, because they may only be given a certain number of times.  HEAT AND COLD  Cold treatment (icing) relieves pain and reduces inflammation. Cold treatment should be applied for 10 to 15 minutes every 2 to 3 hours for inflammation and pain and immediately after any activity that aggravates your symptoms. Use ice packs or massage the area with a piece of ice (ice massage).  Heat treatment may be used prior to performing the stretching and strengthening activities prescribed by your caregiver, physical therapist, or athletic trainer. Use a heat pack or soak the injury in warm water.  SEEK IMMEDIATE MEDICAL CARE IF:  Treatment seems to offer no benefit, or the condition worsens.  Any medications   produce adverse side effects.  EXERCISES- RANGE OF MOTION (ROM) AND STRETCHING EXERCISES - Plantar Fasciitis (Heel Spur Syndrome) These exercises  may help you when beginning to rehabilitate your injury. Your symptoms may resolve with or without further involvement from your physician, physical therapist or athletic trainer. While completing these exercises, remember:   Restoring tissue flexibility helps normal motion to return to the joints. This allows healthier, less painful movement and activity.  An effective stretch should be held for at least 30 seconds.  A stretch should never be painful. You should only feel a gentle lengthening or release in the stretched tissue.  RANGE OF MOTION - Toe Extension, Flexion  Sit with your right / left leg crossed over your opposite knee.  Grasp your toes and gently pull them back toward the top of your foot. You should feel a stretch on the bottom of your toes and/or foot.  Hold this stretch for 10 seconds.  Now, gently pull your toes toward the bottom of your foot. You should feel a stretch on the top of your toes and or foot.  Hold this stretch for 10 seconds. Repeat  times. Complete this stretch 3 times per day.   RANGE OF MOTION - Ankle Dorsiflexion, Active Assisted  Remove shoes and sit on a chair that is preferably not on a carpeted surface.  Place right / left foot under knee. Extend your opposite leg for support.  Keeping your heel down, slide your right / left foot back toward the chair until you feel a stretch at your ankle or calf. If you do not feel a stretch, slide your bottom forward to the edge of the chair, while still keeping your heel down.  Hold this stretch for 10 seconds. Repeat 3 times. Complete this stretch 2 times per day.   STRETCH  Gastroc, Standing  Place hands on wall.  Extend right / left leg, keeping the front knee somewhat bent.  Slightly point your toes inward on your back foot.  Keeping your right / left heel on the floor and your knee straight, shift your weight toward the wall, not allowing your back to arch.  You should feel a gentle stretch  in the right / left calf. Hold this position for 10 seconds. Repeat 3 times. Complete this stretch 2 times per day.  STRETCH  Soleus, Standing  Place hands on wall.  Extend right / left leg, keeping the other knee somewhat bent.  Slightly point your toes inward on your back foot.  Keep your right / left heel on the floor, bend your back knee, and slightly shift your weight over the back leg so that you feel a gentle stretch deep in your back calf.  Hold this position for 10 seconds. Repeat 3 times. Complete this stretch 2 times per day.  STRETCH  Gastrocsoleus, Standing  Note: This exercise can place a lot of stress on your foot and ankle. Please complete this exercise only if specifically instructed by your caregiver.   Place the ball of your right / left foot on a step, keeping your other foot firmly on the same step.  Hold on to the wall or a rail for balance.  Slowly lift your other foot, allowing your body weight to press your heel down over the edge of the step.  You should feel a stretch in your right / left calf.  Hold this position for 10 seconds.  Repeat this exercise with a slight bend in your right /   left knee. Repeat 3 times. Complete this stretch 2 times per day.   STRENGTHENING EXERCISES - Plantar Fasciitis (Heel Spur Syndrome)  These exercises may help you when beginning to rehabilitate your injury. They may resolve your symptoms with or without further involvement from your physician, physical therapist or athletic trainer. While completing these exercises, remember:   Muscles can gain both the endurance and the strength needed for everyday activities through controlled exercises.  Complete these exercises as instructed by your physician, physical therapist or athletic trainer. Progress the resistance and repetitions only as guided.  STRENGTH - Towel Curls  Sit in a chair positioned on a non-carpeted surface.  Place your foot on a towel, keeping your heel  on the floor.  Pull the towel toward your heel by only curling your toes. Keep your heel on the floor. Repeat 3 times. Complete this exercise 2 times per day.  STRENGTH - Ankle Inversion  Secure one end of a rubber exercise band/tubing to a fixed object (table, pole). Loop the other end around your foot just before your toes.  Place your fists between your knees. This will focus your strengthening at your ankle.  Slowly, pull your big toe up and in, making sure the band/tubing is positioned to resist the entire motion.  Hold this position for 10 seconds.  Have your muscles resist the band/tubing as it slowly pulls your foot back to the starting position. Repeat 3 times. Complete this exercises 2 times per day.  Document Released: 03/04/2005 Document Revised: 05/27/2011 Document Reviewed: 06/16/2008   STRAPPING INSTRUCTIONS  Strapping's need to be worn for 5 days for maximum benefit.  If you next appointment is before 5 days, please remove prior to coming in.  Try to avoid putting lotion on feet during the taping process.  The tape will not stick as well and will not be as effective.  If you were given stretching exercises, start these after removal of the tape.  These exercises will actually loosen the tape and you may not receive full benefit of the strapping.  It is important that you wear sturdy shoes at all times when walking.  Bedroom shoes, slippers, flip flops, etc. are not acceptable.  **If at any time while wearing the strapping you should notice any irritation such as a rash, redness, or itching, remove the tape and wash your foot/feet thoroughly.  BATHING INSTRUCTIONS  Take a washcloth, fold it a few times and tape it around your ankle.   Then take a garbage bag, place it over your foot and angle and tape it above and below the washcloth.  TAKE A QUICK SHOWER.  The washcloth should absorb any water that runs down into the garbage bag.  If your foot gets wet, take a towel  and absorb as much of the water as possible. You can also take a blow dryer, put it on low heat, and dry your bandage.  [employer] Patient Information 2014 [place], [place].

## 2018-07-28 NOTE — Progress Notes (Signed)
   Subjective:    Patient ID: Jesse Houston, male    DOB: 1981/01/17, 38 y.o.   MRN: [id number]  HPI 38 year old male presents the office today for concerns of bilateral foot pain on the arch of the foot which is become more consistent over the last 6 months.  He states that he has had this longer intermittently.  States he is on his feet all day.  He is tried some shoe modifications and orthotics without any significant solution.  No recent injury or trauma.  No swelling.  No numbness or tingling.   Review of Systems  All other systems reviewed and are negative.  Past Medical History:  Diagnosis Date  . Allergy   . Asthma   . Diverticulosis   . Low testosterone     Past Surgical History:  Procedure Laterality Date  . COLONOSCOPY N/A 11/06/2017   Procedure: COLONOSCOPY;  Surgeon: Romie Levee, MD;  Location: [facility];  Service: Endoscopy;  Laterality: N/A;  . VASECTOMY    . WISDOM TOOTH EXTRACTION       Current Outpatient Medications:  Marland Kitchen  Multiple Vitamin (MULTIVITAMIN) tablet, Take 1 tablet by mouth daily., Disp: , Rfl:  .  Omega-3 Fatty Acids (FISH OIL) 1000 MG CAPS, Take 2 capsules by mouth daily., Disp: , Rfl:  .  meloxicam (MOBIC) 15 MG tablet, Take 1 tablet (15 mg total) by mouth daily., Disp: 14 tablet, Rfl: 0  No Known Allergies      Objective:   Physical Exam  General: AAO x3, NAD  Dermatological: Skin is warm, dry and supple bilateral. Nails x 10 are well manicured; remaining integument appears unremarkable at this time. There are no open sores, no preulcerative lesions, no rash or signs of infection present.  Vascular: Dorsalis Pedis artery and Posterior Tibial artery pedal pulses are 2/4 bilateral with immedate capillary fill time. Pedal hair growth present. No varicosities and no lower extremity edema present bilateral. There is no pain with calf compression, swelling, warmth, erythema.   Neruologic: Grossly intact via light touch bilateral. Protective  threshold with Semmes Wienstein monofilament intact to all pedal sites bilateral. Negative tinel sign.   Musculoskeletal: Cavus foot type is present.  There is discomfort along medial band plantar fascia within the arch of the foot.  Plantar sugars to be intact.  No significant healing Achilles tendon.  No area pinpoint bony tenderness or pain to vibratory sensation.  No pain of the calcaneus.  No dorsal midfoot pain.  Muscular strength 5/5 in all groups tested bilateral.  Gait: Unassisted, Nonantalgic.     Assessment & Plan:  38 year old male bilateral arch pain, plantar fasciitis, cavus foot type -Treatment options discussed including all alternatives, risks, and complications -Etiology of symptoms were discussed -X-rays were obtained and reviewed with the patient.  Cavus foot type is present.  There is no evidence of acute fracture. -Plantar fascial strapping is applied today. -Discussed shoe modifications and orthotics.  He was molded for orthotics today by Jesse Houston.  -Prescribed mobic. Discussed side effects of the medication and directed to stop if any are to occur and call the office.  -Stretching/icing daily  Return in about 4 weeks (around 08/25/2018).  Jesse Houston DPM

## 2018-08-13 ENCOUNTER — Encounter: Payer: BLUE CROSS/BLUE SHIELD | Admitting: Gastroenterology

## 2018-08-18 ENCOUNTER — Ambulatory Visit: Payer: BC Managed Care – PPO | Admitting: Orthotics

## 2018-08-18 ENCOUNTER — Other Ambulatory Visit: Payer: Self-pay

## 2018-08-18 DIAGNOSIS — M722 Plantar fascial fibromatosis: Secondary | ICD-10-CM

## 2018-08-18 DIAGNOSIS — M216X9 Other acquired deformities of unspecified foot: Secondary | ICD-10-CM

## 2018-08-18 NOTE — Progress Notes (Signed)

## 2018-09-24 DIAGNOSIS — H52223 Regular astigmatism, bilateral: Secondary | ICD-10-CM | POA: Diagnosis not present

## 2018-10-02 ENCOUNTER — Encounter: Payer: Self-pay | Admitting: Gastroenterology

## 2018-10-23 ENCOUNTER — Other Ambulatory Visit: Payer: Self-pay

## 2018-10-23 ENCOUNTER — Ambulatory Visit (AMBULATORY_SURGERY_CENTER): Payer: Self-pay | Admitting: *Deleted

## 2018-10-23 VITALS — Temp 96.8°F | Ht 75.0 in | Wt 174.4 lb

## 2018-10-23 DIAGNOSIS — R1084 Generalized abdominal pain: Secondary | ICD-10-CM

## 2018-10-23 NOTE — Progress Notes (Signed)
No egg or soy allergy known to patient  No issues with past sedation with any surgeries  or procedures, no intubation problems  No diet pills per patient No home 02 use per patient  No blood thinners per patient  Pt denies issues with constipation  No A fib or A flutter  EMMI video sent to pt's e mail   In person PV  Pt and PV RN with mash during PV

## 2018-11-04 ENCOUNTER — Encounter: Payer: Self-pay | Admitting: Gastroenterology

## 2018-11-05 ENCOUNTER — Telehealth: Payer: Self-pay

## 2018-11-05 NOTE — Telephone Encounter (Signed)
Covid-19 screening questions   Do you now or have you had a fever in the last 14 days? NO  Do you have any respiratory symptoms of shortness of breath or cough now or in the last 14 days? NO  Do you have any family members or close contacts with diagnosed or suspected Covid-19 in the past 14 days? NO  Have you been tested for Covid-19 and found to be positive? NO     Confirmed with patient   

## 2018-11-06 ENCOUNTER — Other Ambulatory Visit: Payer: Self-pay

## 2018-11-06 ENCOUNTER — Encounter: Payer: Self-pay | Admitting: Gastroenterology

## 2018-11-06 ENCOUNTER — Ambulatory Visit (AMBULATORY_SURGERY_CENTER): Payer: BC Managed Care – PPO | Admitting: Gastroenterology

## 2018-11-06 VITALS — BP 95/63 | HR 54 | Temp 98.6°F | Resp 15 | Ht 75.0 in | Wt 170.0 lb

## 2018-11-06 DIAGNOSIS — K317 Polyp of stomach and duodenum: Secondary | ICD-10-CM

## 2018-11-06 DIAGNOSIS — R109 Unspecified abdominal pain: Secondary | ICD-10-CM | POA: Diagnosis not present

## 2018-11-06 DIAGNOSIS — R1084 Generalized abdominal pain: Secondary | ICD-10-CM

## 2018-11-06 DIAGNOSIS — K449 Diaphragmatic hernia without obstruction or gangrene: Secondary | ICD-10-CM

## 2018-11-06 MED ORDER — SODIUM CHLORIDE 0.9 % IV SOLN: 500.0000 mL | Freq: Once | INTRAVENOUS | Status: DC

## 2018-11-06 NOTE — Progress Notes (Signed)
Pt's states no medical or surgical changes since previsit or office visit.  JB temps and CW vitals. 

## 2018-11-06 NOTE — Op Note (Signed)
[place] Patient Name: Jesse Houston Procedure Date: 11/06/2018 11:04 AM MRN: [id number] Endoscopist: Justice Britain , MD Age: 38 Referring MD:  Date of Birth: 24-Mar-1980 Gender: Male Account #: [id number] Procedure:                Upper GI endoscopy Indications:              Generalized abdominal pain, Abdominal bloating,                            Dyspepsia Medicines:                Monitored Anesthesia Care Procedure:                Pre-Anesthesia Assessment:                           - Prior to the procedure, a History and Physical                            was performed, and patient medications and                            allergies were reviewed. The patient's tolerance of                            previous anesthesia was also reviewed. The risks                            and benefits of the procedure and the sedation                            options and risks were discussed with the patient.                            All questions were answered, and informed consent                            was obtained. Prior Anticoagulants: The patient has                            taken no previous anticoagulant or antiplatelet                            agents. ASA Grade Assessment: II - A patient with                            mild systemic disease. After reviewing the risks                            and benefits, the patient was deemed in                            satisfactory condition to undergo the procedure.  After obtaining informed consent, the endoscope was                            passed under direct vision. Throughout the                            procedure, the patient's blood pressure, pulse, and                            oxygen saturations were monitored continuously. The                            Endoscope was introduced through the mouth, and                            advanced to the second part of duodenum. The  upper                            GI endoscopy was accomplished without difficulty.                            The patient tolerated the procedure. Scope In: Scope Out: Findings:                 No gross lesions were noted in the entire esophagus.                           A small hiatal hernia was found. The proximal                            extent of the gastric folds (end of tubular                            esophagus) was 41 cm from the incisors. The hiatal                            narrowing was 43 cm from the incisors. The Z-line                            was irregular and found at 40 cm from the incisors.                           Multiple small sessile polyps were found in the                            gastric body - likely fundic gland. Biopsies were                            taken with a cold forceps for histology.                           No other gross lesions were noted in the entire  examined stomach. Biopsies were taken with a cold                            forceps for histology and Helicobacter pylori                            testing.                           No gross lesions were noted in the duodenal bulb,                            in the first portion of the duodenum and in the                            second portion of the duodenum. Biopsies were taken                            with a cold forceps for histology to rule out                            enteropathy. Complications:            No immediate complications. Estimated Blood Loss:     Estimated blood loss was minimal. Impression:               - No gross lesions in esophagus. Small hiatal                            hernia. Irregular Z-line.                           - Multiple gastric polyps - likely fundic gland.                            Biopsied.                           - No other gross lesions in the stomach. Biopsied.                           - No gross lesions  in the duodenal bulb, in the                            first portion of the duodenum and in the second                            portion of the duodenum. Biopsied. Recommendation:           - The patient will be observed post-procedure,                            until all discharge criteria are met.                           -  Discharge patient to home.                           - Patient has a contact number available for                            emergencies. The signs and symptoms of potential                            delayed complications were discussed with the                            patient. Return to normal activities tomorrow.                            Written discharge instructions were provided to the                            patient.                           - Resume previous diet.                           - Continue present medications.                           - Await pathology results.                           - Follow up in clinic to then determine need of                            treating possible SIBO based on prior breath test                            findings.                           - For bloating/discomfort can use Gas-X                            (Simethicone 125 mg 3-4x daily if needed).                           - The findings and recommendations were discussed                            with the patient. Justice Britain, MD 11/06/2018 11:29:59 AM

## 2018-11-06 NOTE — Patient Instructions (Signed)
Handout given for hiatal hernia.  YOU HAD AN ENDOSCOPIC PROCEDURE TODAY AT THE [facility]:   Refer to the procedure report that was given to you for any specific questions about what was found during the examination.  If the procedure report does not answer your questions, please call your gastroenterologist to clarify.  If you requested that your care partner not be given the details of your procedure findings, then the procedure report has been included in a sealed envelope for you to review at your convenience later.  YOU SHOULD EXPECT: Some feelings of bloating in the abdomen. Passage of more gas than usual.  Walking can help get rid of the air that was put into your GI tract during the procedure and reduce the bloating. If you had a lower endoscopy (such as a colonoscopy or flexible sigmoidoscopy) you may notice spotting of blood in your stool or on the toilet paper. If you underwent a bowel prep for your procedure, you may not have a normal bowel movement for a few days.  Please Note:  You might notice some irritation and congestion in your nose or some drainage.  This is from the oxygen used during your procedure.  There is no need for concern and it should clear up in a day or so.  SYMPTOMS TO REPORT IMMEDIATELY:    Following upper endoscopy (EGD)  Vomiting of blood or coffee ground material  New chest pain or pain under the shoulder blades  Painful or persistently difficult swallowing  New shortness of breath  Fever of 100F or higher  Black, tarry-looking stools  For urgent or emergent issues, a gastroenterologist can be reached at any hour by calling [phone].   DIET:  We do recommend a small meal at first, but then you may proceed to your regular diet.  Drink plenty of fluids but you should avoid alcoholic beverages for 24 hours.  ACTIVITY:  You should plan to take it easy for the rest of today and you should NOT DRIVE or use heavy machinery until tomorrow  (because of the sedation medicines used during the test).    FOLLOW UP: Our staff will call the number listed on your records 48-72 hours following your procedure to check on you and address any questions or concerns that you may have regarding the information given to you following your procedure. If we do not reach you, we will leave a message.  We will attempt to reach you two times.  During this call, we will ask if you have developed any symptoms of COVID 19. If you develop any symptoms (ie: fever, flu-like symptoms, shortness of breath, cough etc.) before then, please call [phone].  If you test positive for Covid 19 in the 2 weeks post procedure, please call and report this information to us.    If any biopsies were taken you will be contacted by phone or by letter within the next 1-3 weeks.  Please call us at [phone] if you have not heard about the biopsies in 3 weeks.    SIGNATURES/CONFIDENTIALITY: You and/or your care partner have signed paperwork which will be entered into your electronic medical record.  These signatures attest to the fact that that the information above on your After Visit Summary has been reviewed and is understood.  Full responsibility of the confidentiality of this discharge information lies with you and/or your care-partner. 

## 2018-11-06 NOTE — Progress Notes (Signed)
Called to room to assist during endoscopic procedure.  Patient ID and intended procedure confirmed with present staff. Received instructions for my participation in the procedure from the performing physician.  

## 2018-11-06 NOTE — Progress Notes (Signed)
PT taken to PACU. Monitors in place. VSS. Report given to RN. 

## 2018-11-10 ENCOUNTER — Telehealth: Payer: Self-pay | Admitting: *Deleted

## 2018-11-10 NOTE — Telephone Encounter (Signed)
  Follow up Call-  Call back number 11/06/2018  Post procedure Call Back phone  # [phone]  Permission to leave phone message Yes  Some recent data might be hidden    Number given isn't working

## 2018-11-12 ENCOUNTER — Encounter: Payer: Self-pay | Admitting: Gastroenterology

## 2018-11-12 ENCOUNTER — Telehealth: Payer: Self-pay

## 2018-11-12 NOTE — Telephone Encounter (Signed)
-----   Message from Irving Copas., MD sent at 11/12/2018  4:40 PM EDT ----- Regarding: Follow-up Jesse Houston,Patient needs a follow-up in clinic in approximately 4 to 6 weeks for discussion of symptoms and to consider SIBO treatment. He also find out if he is taking an acid reducing medication because it does not look like we have one on his list.Thank you.GM

## 2018-11-13 NOTE — Telephone Encounter (Signed)
The schedule is not out as of today.  Will send a staff message to schedule pt in a few weeks.

## 2018-12-28 ENCOUNTER — Telehealth: Payer: Self-pay

## 2018-12-28 NOTE — Telephone Encounter (Signed)
appt made with Dr Rush Landmark for 02/04/19 at 850 am.  Letter mailed.

## 2018-12-28 NOTE — Telephone Encounter (Signed)
-----   Message from Timothy Lasso, RN sent at 12/18/2018  8:26 AM EDT -----  ----- Message ----- From: Timothy Lasso, RN Sent: 12/18/2018 To: Timothy Lasso, RN  Pt needs an appt 4-6 weeks see 11/13/18 note

## 2019-02-04 ENCOUNTER — Ambulatory Visit: Payer: BC Managed Care – PPO | Admitting: Gastroenterology

## 2019-02-12 ENCOUNTER — Ambulatory Visit (INDEPENDENT_AMBULATORY_CARE_PROVIDER_SITE_OTHER): Payer: BC Managed Care – PPO | Admitting: Family Medicine

## 2019-02-12 ENCOUNTER — Other Ambulatory Visit: Payer: Self-pay

## 2019-02-12 ENCOUNTER — Encounter: Payer: Self-pay | Admitting: Family Medicine

## 2019-02-12 VITALS — BP 107/68 | HR 58 | Temp 98.1°F | Resp 16 | Ht 75.0 in | Wt 167.6 lb

## 2019-02-12 DIAGNOSIS — Z20828 Contact with and (suspected) exposure to other viral communicable diseases: Secondary | ICD-10-CM | POA: Diagnosis not present

## 2019-02-12 DIAGNOSIS — R202 Paresthesia of skin: Secondary | ICD-10-CM

## 2019-02-12 DIAGNOSIS — Z20822 Contact with and (suspected) exposure to covid-19: Secondary | ICD-10-CM

## 2019-02-12 DIAGNOSIS — K5720 Diverticulitis of large intestine with perforation and abscess without bleeding: Secondary | ICD-10-CM | POA: Diagnosis not present

## 2019-02-12 NOTE — Progress Notes (Signed)
Established Patient Office Visit  Subjective:  Patient ID: Jesse Houston, male    DOB: 1980/09/06  Age: 38 y.o. MRN: [id number]  CC:  Chief Complaint  Patient presents with  . tingling in hands/feet x 2 weeks    pt c/o pain and numbness, no otc meds for pain, pain level 5/10    HPI Jesse Houston presents for   2 weeks of tingling sensations in his hands and feet States that it can be all over his body as well The sensation lasts for few minutes at a time and comes and goes and can last all day This is the first episode  He does not sleep on his back but sleep on his stomach and he wakes up feeling like his legs and arms are falling asleep more than usual.  He exercises a few days a week. There is no difference with exercise.  He denies cyanosis   Past Medical History:  Diagnosis Date  . Allergy   . Asthma   . Diverticulosis   . Low testosterone     Past Surgical History:  Procedure Laterality Date  . COLONOSCOPY N/A 11/06/2017   Procedure: COLONOSCOPY;  Surgeon: Leighton Ruff, MD;  Location: [facility];  Service: Endoscopy;  Laterality: N/A;  . COLONOSCOPY  11/06/2017   tics   . VASECTOMY    . WISDOM TOOTH EXTRACTION      Family History  Problem Relation Age of Onset  . Arthritis Mother   . Brain cancer Son   . Arthritis Maternal Grandmother   . Diabetes Maternal Grandfather   . Uterine cancer Paternal Grandmother   . Cirrhosis Paternal Grandfather   . Diabetes Maternal Aunt   . Colon cancer Neg Hx   . Esophageal cancer Neg Hx   . Inflammatory bowel disease Neg Hx   . Liver disease Neg Hx   . Pancreatic cancer Neg Hx   . Rectal cancer Neg Hx   . Stomach cancer Neg Hx   . Colon polyps Neg Hx     Social History   Socioeconomic History  . Marital status: Married    Spouse name: Not on file  . Number of children: 1  . Years of education: Not on file  . Highest education level: Not on file  Occupational History  . Occupation: Superintendent   Social Needs  . Financial resource strain: Not on file  . Food insecurity    Worry: Not on file    Inability: Not on file  . Transportation needs    Medical: Not on file    Non-medical: Not on file  Tobacco Use  . Smoking status: Never Smoker  . Smokeless tobacco: Never Used  Substance and Sexual Activity  . Alcohol use: Yes    Alcohol/week: 2.0 - 3.0 standard drinks    Types: 2 - 3 Standard drinks or equivalent per week    Comment: social   . Drug use: No  . Sexual activity: Yes    Birth control/protection: Condom  Lifestyle  . Physical activity    Days per week: Not on file    Minutes per session: Not on file  . Stress: Not on file  Relationships  . Social Herbalist on phone: Not on file    Gets together: Not on file    Attends religious service: Not on file    Active member of club or organization: Not on file    Attends meetings of clubs or organizations: Not  on file    Relationship status: Not on file  . Intimate partner violence    Fear of current or ex partner: Not on file    Emotionally abused: Not on file    Physically abused: Not on file    Forced sexual activity: Not on file  Other Topics Concern  . Not on file  Social History Narrative  . Not on file    Outpatient Medications Prior to Visit  Medication Sig Dispense Refill  . Multiple Vitamin (MULTIVITAMIN) tablet Take 1 tablet by mouth daily.    . Omega-3 Fatty Acids (FISH OIL) 1000 MG CAPS Take 2 capsules by mouth daily.    . meloxicam (MOBIC) 15 MG tablet Take 1 tablet (15 mg total) by mouth daily. (Patient not taking: Reported on 10/23/2018) 14 tablet 0   No facility-administered medications prior to visit.     No Known Allergies  ROS Review of Systems Review of Systems  Constitutional: Negative for activity change, appetite change, chills and fever.  HENT: Negative for congestion, nosebleeds, trouble swallowing and voice change.   Respiratory: Negative for cough, shortness of breath  and wheezing.   Gastrointestinal: Negative for diarrhea, nausea and vomiting.  Genitourinary: Negative for difficulty urinating, dysuria, flank pain and hematuria.  Musculoskeletal: Negative for back pain, joint swelling and neck pain.  Neurological: Negative for dizziness, speech difficulty, light-headedness. See hpi See HPI. All other review of systems negative.     Objective:    Physical Exam  BP 107/68 (BP Location: Right Arm, Patient Position: Sitting, Cuff Size: Normal)   Pulse (!) 58   Temp 98.1 F (36.7 C) (Oral)   Resp 16   Ht _0  (1.905 m)   Wt 167 lb 9.6 oz (76 kg)   SpO2 98%   BMI 20.95 kg/m  Wt Readings from Last 3 Encounters:  02/12/19 167 lb 9.6 oz (76 kg)  11/06/18 170 lb (77.1 kg)  10/23/18 174 lb 6.4 oz (79.1 kg)   Physical Exam  Constitutional: Oriented to person, place, and time. Appears well-developed and well-nourished.  HENT:  Head: Normocephalic and atraumatic.  Eyes: Conjunctivae and EOM are normal.  Neck: no thyromegaly, supple Cardiovascular: Normal rate, regular rhythm, normal heart sounds and intact distal pulses.  No murmur heard. Pulmonary/Chest: Effort normal and breath sounds normal. No stridor. No respiratory distress. Has no wheezes.  Neurological: Is alert and oriented to person, place, and time. PT reflex 2+ bilaterally Skin: Skin is warm. Capillary refill takes less than 2 seconds.  Psychiatric: Has a normal mood and affect. Behavior is normal. Judgment and thought content normal.    Health Maintenance Due  Topic Date Due  . TETANUS/TDAP  04/21/1999  . INFLUENZA VACCINE  10/17/2018    There are no preventive care reminders to display for this patient.  Lab Results  Component Value Date   TSH 1.165 07/08/2014   Lab Results  Component Value Date   WBC 10.1 05/05/2018   HGB 15.5 05/05/2018   HCT 45.8 05/05/2018   MCV 87.2 05/05/2018   PLT 364.0 05/05/2018   Lab Results  Component Value Date   NA 137 08/17/2017   K  3.8 08/17/2017   CO2 27 08/17/2017   GLUCOSE 96 08/17/2017   BUN 9 08/17/2017   CREATININE 0.92 08/17/2017   BILITOT 0.6 05/05/2018   ALKPHOS 85 05/05/2018   AST 19 05/05/2018   ALT 16 05/05/2018   PROT 7.8 05/05/2018   ALBUMIN 5.0 05/05/2018   CALCIUM  9.1 08/17/2017   ANIONGAP 6 08/17/2017   Lab Results  Component Value Date   CHOL 160 04/18/2017   Lab Results  Component Value Date   HDL 40 04/18/2017   Lab Results  Component Value Date   LDLCALC 103 (H) 04/18/2017   Lab Results  Component Value Date   TRIG 85 04/18/2017   Lab Results  Component Value Date   CHOLHDL 4.0 04/18/2017   Lab Results  Component Value Date   HGBA1C 5.1 04/18/2017      Assessment & Plan:   Problem List Items Addressed This Visit    None    Visit Diagnoses    Paresthesia    -  Primary   Relevant Orders   Vitamin B12   CBC   CMP14+EGFR   Magnesium   Exposure to COVID-19 virus       Relevant Orders   Novel Coronavirus, NAA ([facility])     Discussed that the causes could be metabolic, infectious, nutritional Will check labs Pt with a wife positive for covid 3 weeks ago but was not screened. Will screen for covid today. History of diverticulitis with large bowel perforation   No orders of the defined types were placed in this encounter.   Follow-up: No follow-ups on file.    Forrest Moron, MD

## 2019-02-12 NOTE — Patient Instructions (Signed)
° ° ° °  If you have lab work done today you will be contacted with your lab results within the next 2 weeks.  If you have not heard from us then please contact us. The fastest way to get your results is to register for My Chart. ° ° °IF you received an x-ray today, you will receive an invoice from [facility]. Please contact [facility] at [phone] with questions or concerns regarding your invoice.  ° °IF you received labwork today, you will receive an invoice from [facility]. Please contact [facility] at [phone] with questions or concerns regarding your invoice.  ° °Our billing staff will not be able to assist you with questions regarding bills from these companies. ° °You will be contacted with the lab results as soon as they are available. The fastest way to get your results is to activate your My Chart account. Instructions are located on the last page of this paperwork. If you have not heard from us regarding the results in 2 weeks, please contact this office. °  ° ° ° °

## 2019-02-13 LAB — CBC
Hematocrit: 43.5 % (ref 37.5–51.0)
Hemoglobin: 14.7 g/dL (ref 13.0–17.7)
MCH: 29.7 pg (ref 26.6–33.0)
MCHC: 33.8 g/dL (ref 31.5–35.7)
MCV: 88 fL (ref 79–97)
Platelets: 362 10*3/uL (ref 150–450)
RBC: 4.95 x10E6/uL (ref 4.14–5.80)
RDW: 11.8 % (ref 11.6–15.4)
WBC: 6.8 10*3/uL (ref 3.4–10.8)

## 2019-02-13 LAB — CMP14+EGFR
ALT: 15 IU/L (ref 0–44)
AST: 19 IU/L (ref 0–40)
Albumin/Globulin Ratio: 2.3 — ABNORMAL HIGH (ref 1.2–2.2)
Albumin: 4.8 g/dL (ref 4.0–5.0)
Alkaline Phosphatase: 83 IU/L (ref 39–117)
BUN/Creatinine Ratio: 12 (ref 9–20)
BUN: 10 mg/dL (ref 6–20)
Bilirubin Total: 0.7 mg/dL (ref 0.0–1.2)
CO2: 21 mmol/L (ref 20–29)
Calcium: 9.5 mg/dL (ref 8.7–10.2)
Chloride: 102 mmol/L (ref 96–106)
Creatinine, Ser: 0.84 mg/dL (ref 0.76–1.27)
GFR calc Af Amer: 128 mL/min/{1.73_m2} (ref >59)
GFR calc non Af Amer: 111 mL/min/{1.73_m2} (ref >59)
Globulin, Total: 2.1 g/dL (ref 1.5–4.5)
Glucose: 83 mg/dL (ref 65–99)
Potassium: 4.4 mmol/L (ref 3.5–5.2)
Sodium: 140 mmol/L (ref 134–144)
Total Protein: 6.9 g/dL (ref 6.0–8.5)

## 2019-02-13 LAB — VITAMIN B12: Vitamin B-12: 1061 pg/mL (ref 232–1245)

## 2019-02-13 LAB — MAGNESIUM: Magnesium: 2.2 mg/dL (ref 1.6–2.3)

## 2019-02-14 LAB — NOVEL CORONAVIRUS, NAA: SARS-CoV-2, NAA: NOT DETECTED

## 2019-02-15 ENCOUNTER — Telehealth: Payer: Self-pay | Admitting: Family Medicine

## 2019-02-15 NOTE — Telephone Encounter (Signed)
Pt called said they missed call about their lab results

## 2019-02-16 NOTE — Telephone Encounter (Signed)
Spoke with pt and lab results given and pt verbalized understanding.

## 2019-04-09 DIAGNOSIS — R102 Pelvic and perineal pain: Secondary | ICD-10-CM | POA: Diagnosis not present

## 2019-04-09 DIAGNOSIS — R351 Nocturia: Secondary | ICD-10-CM | POA: Diagnosis not present

## 2019-05-07 ENCOUNTER — Encounter: Payer: BC Managed Care – PPO | Admitting: Registered Nurse

## 2019-05-14 ENCOUNTER — Encounter: Payer: BC Managed Care – PPO | Admitting: Registered Nurse

## 2019-05-18 ENCOUNTER — Encounter: Payer: Self-pay | Admitting: Registered Nurse

## 2019-05-18 ENCOUNTER — Other Ambulatory Visit: Payer: Self-pay

## 2019-05-18 ENCOUNTER — Ambulatory Visit (INDEPENDENT_AMBULATORY_CARE_PROVIDER_SITE_OTHER): Payer: BC Managed Care – PPO | Admitting: Registered Nurse

## 2019-05-18 VITALS — BP 107/70 | HR 66 | Temp 97.7°F | Ht 75.0 in | Wt 179.8 lb

## 2019-05-18 DIAGNOSIS — Z130 Encounter for screening for diseases of the blood and blood-forming organs and certain disorders involving the immune mechanism: Secondary | ICD-10-CM

## 2019-05-18 DIAGNOSIS — Z13228 Encounter for screening for other metabolic disorders: Secondary | ICD-10-CM | POA: Diagnosis not present

## 2019-05-18 DIAGNOSIS — Z13220 Encounter for screening for lipoid disorders: Secondary | ICD-10-CM

## 2019-05-18 DIAGNOSIS — Z7689 Persons encountering health services in other specified circumstances: Secondary | ICD-10-CM | POA: Diagnosis not present

## 2019-05-18 DIAGNOSIS — Z0000 Encounter for general adult medical examination without abnormal findings: Secondary | ICD-10-CM | POA: Diagnosis not present

## 2019-05-18 DIAGNOSIS — Z1329 Encounter for screening for other suspected endocrine disorder: Secondary | ICD-10-CM

## 2019-05-18 NOTE — Progress Notes (Signed)
Established Patient Office Visit  Subjective:  Patient ID: Jesse Houston, male    DOB: [birth year]  Age: 39 y.o. MRN: [id number]  CC:  Chief Complaint  Patient presents with  . Transitions Of Care    physical    HPI Jesse Houston presents for CPE and labs  Has paperwork from his employer  No concerns at this time  Hx of diverticulosis in late 2019 - had followed with GI, but no longer has concerns or symptoms.   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 Married x 17 years. Has a 11 yo child.  Past Medical History:  Diagnosis Date  . Allergy   . Asthma   . Diverticulosis   . Diverticulosis   . Low testosterone     Past Surgical History:  Procedure Laterality Date  . COLONOSCOPY N/A 11/06/2017   Procedure: COLONOSCOPY;  Surgeon: Romie Levee, MD;  Location: [facility];  Service: Endoscopy;  Laterality: N/A;  . COLONOSCOPY  11/06/2017   tics   . VASECTOMY    . WISDOM TOOTH EXTRACTION      Family History  Problem Relation Age of Onset  . Arthritis Mother   . Brain cancer Son   . Arthritis Maternal Grandmother   . Diabetes Maternal Grandfather   . Uterine cancer Paternal Grandmother   . Cirrhosis Paternal Grandfather   . Diabetes Maternal Aunt   . Colon cancer Neg Hx   . Esophageal cancer Neg Hx   . Inflammatory bowel disease Neg Hx   . Liver disease Neg Hx   . Pancreatic cancer Neg Hx   . Rectal cancer Neg Hx   . Stomach cancer Neg Hx   . Colon polyps Neg Hx     Social History   Socioeconomic History  . Marital status: Married    Spouse name: Not on file  . Number of children: 1  . Years of education: Not on file  . Highest education level: Not on file  Occupational History  . Occupation: Superintendent  Tobacco Use  . Smoking status: Never Smoker  . Smokeless tobacco: Never Used  Substance and Sexual Activity  . Alcohol use: Yes    Alcohol/week: 2.0 - 3.0 standard drinks    Types: 2 - 3 Standard drinks or equivalent per week    Comment:  social   . Drug use: No  . Sexual activity: Yes    Birth control/protection: Condom  Other Topics Concern  . Not on file  Social History Narrative  . Not on file   Social Determinants of Health   Financial Resource Strain:   . Difficulty of Paying Living Expenses: Not on file  Food Insecurity:   . Worried About Programme researcher, broadcasting/film/video in the Last Year: Not on file  . Ran Out of Food in the Last Year: Not on file  Transportation Needs:   . Lack of Transportation (Medical): Not on file  . Lack of Transportation (Non-Medical): Not on file  Physical Activity:   . Days of Exercise per Week: Not on file  . Minutes of Exercise per Session: Not on file  Stress:   . Feeling of Stress : Not on file  Social Connections:   . Frequency of Communication with Friends and Family: Not on file  . Frequency of Social Gatherings with Friends and Family: Not on file  . Attends Religious Services: Not on file  . Active Member of Clubs or Organizations: Not on file  . Attends Banker Meetings:  Not on file  . Marital Status: Not on file  Intimate Partner Violence:   . Fear of Current or Ex-Partner: Not on file  . Emotionally Abused: Not on file  . Physically Abused: Not on file  . Sexually Abused: Not on file    Outpatient Medications Prior to Visit  Medication Sig Dispense Refill  . Multiple Vitamin (MULTIVITAMIN) tablet Take 1 tablet by mouth daily.    . Omega-3 Fatty Acids (FISH OIL) 1000 MG CAPS Take 2 capsules by mouth daily.    . meloxicam (MOBIC) 15 MG tablet Take 1 tablet (15 mg total) by mouth daily. (Patient not taking: Reported on 10/23/2018) 14 tablet 0   No facility-administered medications prior to visit.    No Known Allergies  ROS Review of Systems  Constitutional: Negative.   HENT: Negative.   Eyes: Negative.   Respiratory: Negative.   Cardiovascular: Negative.   Gastrointestinal: Negative.   Endocrine: Negative.   Genitourinary: Negative.   Musculoskeletal:  Negative.   Skin: Negative.   Allergic/Immunologic: Negative.   Neurological: Negative.   Hematological: Negative.   Psychiatric/Behavioral: Negative.   All other systems reviewed and are negative.     Objective:    Physical Exam  Constitutional: He is oriented to person, place, and time. He appears well-developed and well-nourished. No distress.  HENT:  Head: Normocephalic and atraumatic.  Right Ear: External ear normal.  Left Ear: External ear normal.  Nose: Nose normal.  Mouth/Throat: Oropharynx is clear and moist. No oropharyngeal exudate.  Eyes: Pupils are equal, round, and reactive to light. Conjunctivae and EOM are normal. Right eye exhibits no discharge. Left eye exhibits no discharge. No scleral icterus.  Neck: No tracheal deviation present. No thyromegaly present.  Cardiovascular: Normal rate, regular rhythm, normal heart sounds and intact distal pulses. Exam reveals no gallop and no friction rub.  No murmur heard. Pulmonary/Chest: Effort normal and breath sounds normal. No respiratory distress. He has no wheezes. He has no rales. He exhibits no tenderness.  Abdominal: Soft. Bowel sounds are normal. He exhibits no distension and no mass. There is no abdominal tenderness. There is no rebound and no guarding.  Musculoskeletal:        General: No tenderness, deformity or edema. Normal range of motion.     Cervical back: Normal range of motion and neck supple.  Lymphadenopathy:    He has no cervical adenopathy.  Neurological: He is alert and oriented to person, place, and time. No cranial nerve deficit. He exhibits normal muscle tone. Coordination normal.  Skin: Skin is warm and dry. No rash noted. He is not diaphoretic. No erythema. No pallor.  Psychiatric: He has a normal mood and affect. His behavior is normal. Judgment and thought content normal.  Nursing note and vitals reviewed.   BP 107/70   Pulse 66   Temp 97.7 F (36.5 C) (Temporal)   Ht 6\' 3"  (1.905 m)   Wt  179 lb 12.8 oz (81.6 kg)   SpO2 98%   BMI 22.47 kg/m  Wt Readings from Last 3 Encounters:  05/18/19 179 lb 12.8 oz (81.6 kg)  02/12/19 167 lb 9.6 oz (76 kg)  11/06/18 170 lb (77.1 kg)     There are no preventive care reminders to display for this patient.  There are no preventive care reminders to display for this patient.  Lab Results  Component Value Date   TSH 1.165 07/08/2014   Lab Results  Component Value Date   WBC 6.8 02/12/2019  HGB 14.7 02/12/2019   HCT 43.5 02/12/2019   MCV 88 02/12/2019   PLT 362 02/12/2019   Lab Results  Component Value Date   NA 140 02/12/2019   K 4.4 02/12/2019   CO2 21 02/12/2019   GLUCOSE 83 02/12/2019   BUN 10 02/12/2019   CREATININE 0.84 02/12/2019   BILITOT 0.7 02/12/2019   ALKPHOS 83 02/12/2019   AST 19 02/12/2019   ALT 15 02/12/2019   PROT 6.9 02/12/2019   ALBUMIN 4.8 02/12/2019   CALCIUM 9.5 02/12/2019   ANIONGAP 6 08/17/2017   Lab Results  Component Value Date   CHOL 160 04/18/2017   Lab Results  Component Value Date   HDL 40 04/18/2017   Lab Results  Component Value Date   LDLCALC 103 (H) 04/18/2017   Lab Results  Component Value Date   TRIG 85 04/18/2017   Lab Results  Component Value Date   CHOLHDL 4.0 04/18/2017   Lab Results  Component Value Date   HGBA1C 5.1 04/18/2017      Assessment & Plan:   Problem List Items Addressed This Visit    None    Visit Diagnoses    Annual physical exam    -  Primary   Screening for endocrine, metabolic and immunity disorder       Relevant Orders   CBC With Differential   Comprehensive metabolic panel   TSH   Lipid screening       Relevant Orders   Lipid panel   Encounter to establish care          No orders of the defined types were placed in this encounter.   Follow-up: No follow-ups on file.   PLAN  Unremarkable exam  Labs drawn, will follow up as warranted  Return in 1 year for CPE  Patient encouraged to call clinic with any questions,  comments, or concerns.  Janeece Agee, NP

## 2019-05-18 NOTE — Patient Instructions (Signed)
° ° ° °  If you have lab work done today you will be contacted with your lab results within the next 2 weeks.  If you have not heard from us then please contact us. The fastest way to get your results is to register for My Chart. ° ° °IF you received an x-ray today, you will receive an invoice from [facility]. Please contact [facility] at [phone] with questions or concerns regarding your invoice.  ° °IF you received labwork today, you will receive an invoice from [facility]. Please contact [facility] at [phone] with questions or concerns regarding your invoice.  ° °Our billing staff will not be able to assist you with questions regarding bills from these companies. ° °You will be contacted with the lab results as soon as they are available. The fastest way to get your results is to activate your My Chart account. Instructions are located on the last page of this paperwork. If you have not heard from us regarding the results in 2 weeks, please contact this office. °  ° ° ° °

## 2019-05-19 ENCOUNTER — Other Ambulatory Visit: Payer: Self-pay | Admitting: Registered Nurse

## 2019-05-19 DIAGNOSIS — E785 Hyperlipidemia, unspecified: Secondary | ICD-10-CM

## 2019-05-19 LAB — CBC WITH DIFFERENTIAL
Basophils Absolute: 0 10*3/uL (ref 0.0–0.2)
Basos: 1 %
EOS (ABSOLUTE): 0.1 10*3/uL (ref 0.0–0.4)
Eos: 2 %
Hematocrit: 48.3 % (ref 37.5–51.0)
Hemoglobin: 16.4 g/dL (ref 13.0–17.7)
Immature Grans (Abs): 0 10*3/uL (ref 0.0–0.1)
Immature Granulocytes: 0 %
Lymphocytes Absolute: 2.3 10*3/uL (ref 0.7–3.1)
Lymphs: 35 %
MCH: 29.6 pg (ref 26.6–33.0)
MCHC: 34 g/dL (ref 31.5–35.7)
MCV: 87 fL (ref 79–97)
Monocytes Absolute: 0.5 10*3/uL (ref 0.1–0.9)
Monocytes: 7 %
Neutrophils Absolute: 3.7 10*3/uL (ref 1.4–7.0)
Neutrophils: 55 %
RBC: 5.54 x10E6/uL (ref 4.14–5.80)
RDW: 11.9 % (ref 11.6–15.4)
WBC: 6.7 10*3/uL (ref 3.4–10.8)

## 2019-05-19 LAB — COMPREHENSIVE METABOLIC PANEL
ALT: 18 IU/L (ref 0–44)
AST: 30 IU/L (ref 0–40)
Albumin/Globulin Ratio: 2 (ref 1.2–2.2)
Albumin: 4.8 g/dL (ref 4.0–5.0)
Alkaline Phosphatase: 87 IU/L (ref 39–117)
BUN/Creatinine Ratio: 15 (ref 9–20)
BUN: 15 mg/dL (ref 6–20)
Bilirubin Total: 0.8 mg/dL (ref 0.0–1.2)
CO2: 23 mmol/L (ref 20–29)
Calcium: 9.4 mg/dL (ref 8.7–10.2)
Chloride: 101 mmol/L (ref 96–106)
Creatinine, Ser: 0.97 mg/dL (ref 0.76–1.27)
GFR calc Af Amer: 113 mL/min/{1.73_m2} (ref >59)
GFR calc non Af Amer: 98 mL/min/{1.73_m2} (ref >59)
Globulin, Total: 2.4 g/dL (ref 1.5–4.5)
Glucose: 91 mg/dL (ref 65–99)
Potassium: 4.3 mmol/L (ref 3.5–5.2)
Sodium: 138 mmol/L (ref 134–144)
Total Protein: 7.2 g/dL (ref 6.0–8.5)

## 2019-05-19 LAB — LIPID PANEL
Chol/HDL Ratio: 4.6 ratio (ref 0.0–5.0)
Cholesterol, Total: 219 mg/dL — ABNORMAL HIGH (ref 100–199)
HDL: 48 mg/dL (ref >39)
LDL Chol Calc (NIH): 154 mg/dL — ABNORMAL HIGH (ref 0–99)
Triglycerides: 95 mg/dL (ref 0–149)
VLDL Cholesterol Cal: 17 mg/dL (ref 5–40)

## 2019-05-19 LAB — TSH: TSH: 1.19 u[IU]/mL (ref 0.450–4.500)

## 2019-05-19 MED ORDER — ATORVASTATIN CALCIUM 20 MG PO TABS: 20.0000 mg | ORAL_TABLET | Freq: Every day | ORAL | 3 refills | Status: DC

## 2019-05-19 NOTE — Progress Notes (Signed)
Good evening,  If we could call Jesse Houston and let him know his labs are back, that would be great. Overall, he's looking fine, but his cholesterol is a little high. I want to start him on atorvastatin at a dose of 20mg . He should take this with dinner daily. I don't anticipate side effects but should he have concerns, he can stop taking the medication and call me. I'll send over a one year supply. He should continue to work to improve his diet and exercise, both of which will help his cholesterol.   Thank you,  , NP

## 2019-06-07 ENCOUNTER — Other Ambulatory Visit: Payer: Self-pay

## 2019-06-07 ENCOUNTER — Ambulatory Visit (INDEPENDENT_AMBULATORY_CARE_PROVIDER_SITE_OTHER): Payer: BC Managed Care – PPO | Admitting: Registered Nurse

## 2019-06-07 ENCOUNTER — Ambulatory Visit: Payer: Self-pay | Admitting: Registered Nurse

## 2019-06-07 ENCOUNTER — Encounter: Payer: Self-pay | Admitting: Registered Nurse

## 2019-06-07 VITALS — BP 113/71 | HR 62 | Temp 98.0°F | Ht 75.0 in | Wt 177.8 lb

## 2019-06-07 DIAGNOSIS — R000 Tachycardia, unspecified: Secondary | ICD-10-CM

## 2019-06-07 NOTE — Telephone Encounter (Signed)
FYI to message below. Pt is scheduled to come to the office today at 3:10pm.   Thanks

## 2019-06-07 NOTE — Progress Notes (Signed)
Acute Office Visit  Subjective:    Patient ID: Jesse Houston, male    DOB: 1980/11/07, 39 y.o.   MRN: [id number]  Chief Complaint  Patient presents with  . Chest Pain    patient states he has been having rapid palpitations which started back up on friday. Per patient its also some very mild chest pain as well    HPI Patient is in today for rapid heartbeat Onset Friday Intermittent, but seems to be happening more often than not Feels some discomfort in chest, not pain Denies headache, shob, doe, chest pain, dependent edema, loc, visual changes BP wnl today Started station therapy 1 mo ago - tolerating well without concern No clear instigating factor  No further complaints  Past Medical History:  Diagnosis Date  . Allergy   . Asthma   . Diverticulosis   . Diverticulosis   . Low testosterone     Past Surgical History:  Procedure Laterality Date  . COLONOSCOPY N/A 11/06/2017   Procedure: COLONOSCOPY;  Surgeon: Leighton Ruff, MD;  Location: [facility];  Service: Endoscopy;  Laterality: N/A;  . COLONOSCOPY  11/06/2017   tics   . VASECTOMY    . WISDOM TOOTH EXTRACTION      Family History  Problem Relation Age of Onset  . Arthritis Mother   . Brain cancer Son   . Arthritis Maternal Grandmother   . Diabetes Maternal Grandfather   . Uterine cancer Paternal Grandmother   . Cirrhosis Paternal Grandfather   . Diabetes Maternal Aunt   . Colon cancer Neg Hx   . Esophageal cancer Neg Hx   . Inflammatory bowel disease Neg Hx   . Liver disease Neg Hx   . Pancreatic cancer Neg Hx   . Rectal cancer Neg Hx   . Stomach cancer Neg Hx   . Colon polyps Neg Hx     Social History   Socioeconomic History  . Marital status: Married    Spouse name: Not on file  . Number of children: 1  . Years of education: Not on file  . Highest education level: Not on file  Occupational History  . Occupation: Superintendent  Tobacco Use  . Smoking status: Never Smoker  . Smokeless  tobacco: Never Used  Substance and Sexual Activity  . Alcohol use: Yes    Alcohol/week: 2.0 - 3.0 standard drinks    Types: 2 - 3 Standard drinks or equivalent per week    Comment: social   . Drug use: No  . Sexual activity: Yes    Birth control/protection: Condom  Other Topics Concern  . Not on file  Social History Narrative  . Not on file   Social Determinants of Health   Financial Resource Strain:   . Difficulty of Paying Living Expenses:   Food Insecurity:   . Worried About Charity fundraiser in the Last Year:   . Arboriculturist in the Last Year:   Transportation Needs:   . Film/video editor (Medical):   Marland Kitchen Lack of Transportation (Non-Medical):   Physical Activity:   . Days of Exercise per Week:   . Minutes of Exercise per Session:   Stress:   . Feeling of Stress :   Social Connections:   . Frequency of Communication with Friends and Family:   . Frequency of Social Gatherings with Friends and Family:   . Attends Religious Services:   . Active Member of Clubs or Organizations:   . Attends Club or  Organization Meetings:   Marland Kitchen Marital Status:   Intimate Partner Violence:   . Fear of Current or Ex-Partner:   . Emotionally Abused:   Marland Kitchen Physically Abused:   . Sexually Abused:     Outpatient Medications Prior to Visit  Medication Sig Dispense Refill  . atorvastatin (LIPITOR) 20 MG tablet Take 1 tablet (20 mg total) by mouth daily. 90 tablet 3  . Multiple Vitamin (MULTIVITAMIN) tablet Take 1 tablet by mouth daily.    . Omega-3 Fatty Acids (FISH OIL) 1000 MG CAPS Take 2 capsules by mouth daily.    . meloxicam (MOBIC) 15 MG tablet Take 1 tablet (15 mg total) by mouth daily. (Patient not taking: Reported on 10/23/2018) 14 tablet 0   No facility-administered medications prior to visit.    No Known Allergies  Review of Systems  Constitutional: Negative.   HENT: Negative.   Eyes: Negative.   Respiratory: Negative.   Cardiovascular: Positive for palpitations.  Negative for chest pain and leg swelling.  Gastrointestinal: Negative.   Endocrine: Negative.   Genitourinary: Negative.   Musculoskeletal: Negative.   Skin: Negative.   Allergic/Immunologic: Negative.   Neurological: Negative.   Hematological: Negative.   Psychiatric/Behavioral: Negative.   All other systems reviewed and are negative.      Objective:    Physical Exam Vitals and nursing note reviewed.  Constitutional:      General: He is not in acute distress.    Appearance: He is well-developed and normal weight. He is not ill-appearing, toxic-appearing or diaphoretic.  Cardiovascular:     Rate and Rhythm: Normal rate and regular rhythm.     Heart sounds: Normal heart sounds. Heart sounds not distant. No murmur. No systolic murmur. No diastolic murmur. No friction rub. No gallop.   Pulmonary:     Effort: Pulmonary effort is normal.     Breath sounds: Normal breath sounds.  Skin:    General: Skin is warm and dry.     Capillary Refill: Capillary refill takes less than 2 seconds.     Coloration: Skin is not cyanotic or pale.     Findings: No ecchymosis, erythema or rash.     Nails: There is no clubbing.  Neurological:     General: No focal deficit present.     Mental Status: He is alert and oriented to person, place, and time.     Cranial Nerves: No cranial nerve deficit.     Motor: No weakness.  Psychiatric:        Mood and Affect: Mood normal. Mood is not anxious.        Behavior: Behavior normal. Behavior is not agitated.     BP 113/71   Pulse 62   Temp 98 F (36.7 C) (Temporal)   Ht 6\' 3"  (1.905 m)   Wt 177 lb 12.8 oz (80.6 kg)   SpO2 98%   BMI 22.22 kg/m  Wt Readings from Last 3 Encounters:  06/07/19 177 lb 12.8 oz (80.6 kg)  05/18/19 179 lb 12.8 oz (81.6 kg)  02/12/19 167 lb 9.6 oz (76 kg)    There are no preventive care reminders to display for this patient.  There are no preventive care reminders to display for this patient.   Lab Results    Component Value Date   TSH 1.190 05/18/2019   Lab Results  Component Value Date   WBC 6.7 05/18/2019   HGB 16.4 05/18/2019   HCT 48.3 05/18/2019   MCV 87 05/18/2019   PLT  362 02/12/2019   Lab Results  Component Value Date   NA 138 05/18/2019   K 4.3 05/18/2019   CO2 23 05/18/2019   GLUCOSE 91 05/18/2019   BUN 15 05/18/2019   CREATININE 0.97 05/18/2019   BILITOT 0.8 05/18/2019   ALKPHOS 87 05/18/2019   AST 30 05/18/2019   ALT 18 05/18/2019   PROT 7.2 05/18/2019   ALBUMIN 4.8 05/18/2019   CALCIUM 9.4 05/18/2019   ANIONGAP 6 08/17/2017   Lab Results  Component Value Date   CHOL 219 (H) 05/18/2019   Lab Results  Component Value Date   HDL 48 05/18/2019   Lab Results  Component Value Date   LDLCALC 154 (H) 05/18/2019   Lab Results  Component Value Date   TRIG 95 05/18/2019   Lab Results  Component Value Date   CHOLHDL 4.6 05/18/2019   Lab Results  Component Value Date   HGBA1C 5.1 04/18/2017       Assessment & Plan:   Problem List Items Addressed This Visit    None    Visit Diagnoses    Rapid heartbeat    -  Primary   Relevant Orders   EKG 12-Lead (Completed)   Ambulatory referral to Cardiology       No orders of the defined types were placed in this encounter.  PLAN  EKg shows concern for LVH, but no ST or T abnormalities.   Exam unrevealing   Labs from 4 weeks ago reassuring  Will refer to cards for echo and further workup  Avoid strenuous activity, ed precautions reviewed, pt demonstrates understanding.  Patient encouraged to call clinic with any questions, comments, or concerns.  Janeece Agee, NP

## 2019-06-07 NOTE — Patient Instructions (Signed)
° ° ° °  If you have lab work done today you will be contacted with your lab results within the next 2 weeks.  If you have not heard from us then please contact us. The fastest way to get your results is to register for My Chart. ° ° °IF you received an x-ray today, you will receive an invoice from [facility]. Please contact [facility] at [phone] with questions or concerns regarding your invoice.  ° °IF you received labwork today, you will receive an invoice from [facility]. Please contact [facility] at [phone] with questions or concerns regarding your invoice.  ° °Our billing staff will not be able to assist you with questions regarding bills from these companies. ° °You will be contacted with the lab results as soon as they are available. The fastest way to get your results is to activate your My Chart account. Instructions are located on the last page of this paperwork. If you have not heard from us regarding the results in 2 weeks, please contact this office. °  ° ° ° °

## 2019-06-07 NOTE — Telephone Encounter (Signed)
Pt reports palpitations, onset Friday. States  had occurred 4 weeks ago, had wellness visit 2 weeks ago, placed on Lipitor "Palpitations stopped." reoccurred Friday. States occur most of the day. Also reports chest pressure at times, at rest only, not presently. Denies dizziness, no nausea,no  diaphoresis.  States some increased SOB with exertion past several months, "I run 2 miles and get more SOB than usual." Did advise UC as pt presently with palpitations, declined.  NT called practice for consideration of appt today. Spoke with Almira Coaster, appt scheduled for 1500 today.  Advised ED if symptoms worsen, chest pressure reoccurs. Pt verbalizes understanding.   Reason for Disposition . [1] Skipped or extra beat(s) AND [2] occurs 4 or more times per minute    Has appt at 1500 today  Answer Assessment - Initial Assessment Questions 1. DESCRIPTION: "Please describe your heart rate or heart beat that you are having" (e.g., fast/slow, regular/irregular, skipped or extra beats, "palpitations")     Palpitations 2. ONSET: "When did it start?" (Minutes, hours or days)      Friday 3. DURATION: "How long does it last" (e.g., seconds, minutes, hours)     90% of the day. 4. PATTERN "Does it come and go, or has it been constant since it started?"  "Does it get worse with exertion?"   "Are you feeling it now?"    Comes and goes, most of the day though. 5. TAP: "Using your hand, can you tap out what you are feeling on a chair or table in front of you, so that I can hear?" (Note: not all patients can do this)       no 6. HEART RATE: "Can you tell me your heart rate?" "How many beats in 15 seconds?"  (Note: not all patients can do this)    no    7. RECURRENT SYMPTOM: "Have you ever had this before?" If so, ask: "When was the last time?" and "What happened that time?"      Yes 4 weeks ago. 8. CAUSE: "What do you think is causing the palpitations?"     unsure 9. CARDIAC HISTORY: "Do you have any history of heart  disease?" (e.g., heart attack, angina, bypass surgery, angioplasty, arrhythmia)     no 10. OTHER SYMPTOMS: "Do you have any other symptoms?" (e.g., dizziness, chest pain, sweating, difficulty breathing)      Chest pressure at times, not presently, mostly at rest.  Protocols used: HEART RATE AND HEARTBEAT QUESTIONS-A-AH

## 2019-06-10 ENCOUNTER — Telehealth: Payer: Self-pay | Admitting: Radiology

## 2019-06-10 ENCOUNTER — Ambulatory Visit (INDEPENDENT_AMBULATORY_CARE_PROVIDER_SITE_OTHER): Payer: BC Managed Care – PPO | Admitting: Internal Medicine

## 2019-06-10 ENCOUNTER — Other Ambulatory Visit: Payer: Self-pay

## 2019-06-10 ENCOUNTER — Encounter: Payer: Self-pay | Admitting: Internal Medicine

## 2019-06-10 VITALS — BP 102/72 | HR 98 | Ht 75.0 in | Wt 180.0 lb

## 2019-06-10 DIAGNOSIS — R002 Palpitations: Secondary | ICD-10-CM | POA: Diagnosis not present

## 2019-06-10 NOTE — Telephone Encounter (Signed)
Enrolled patient for a 14 day Zio monitor to be mailed to patients home.  

## 2019-06-10 NOTE — Patient Instructions (Signed)
Medication Instructions:  Your physician recommends that you continue on your current medications as directed. Please refer to the Current Medication list given to you today.  *If you need a refill on your cardiac medications before your next appointment, please call your pharmacy*   Testing/Procedures: [place] Monitor Instructions   Your physician has requested you wear your ZIO patch monitor 14 days.   This is a single patch monitor.  [employer] supplies one patch monitor per enrollment.  Additional stickers are not available.   Please do not apply patch if you will be having a Nuclear Stress Test, Echocardiogram, Cardiac CT, MRI, or Chest Xray during the time frame you would be wearing the monitor. The patch cannot be worn during these tests.  You cannot remove and re-apply the ZIO XT patch monitor.   Your ZIO patch monitor will be sent USPS Priority mail from [facility] directly to your home address. The monitor may also be mailed to a PO BOX if home delivery is not available.   It may take 3-5 days to receive your monitor after you have been enrolled.   Once you have received you monitor, please review enclosed instructions.  Your monitor has already been registered assigning a specific monitor serial # to you.   Applying the monitor   Shave hair from upper left chest.   Hold abrader disc by orange tab.  Rub abrader in 40 strokes over left upper chest as indicated in your monitor instructions.   Clean area with 4 enclosed alcohol pads .  Use all pads to assure are is cleaned thoroughly.  Let dry.   Apply patch as indicated in monitor instructions.  Patch will be place under collarbone on left side of chest with arrow pointing upward.   Rub patch adhesive wings for 2 minutes.Remove white label marked "1".  Remove white label marked "2".  Rub patch adhesive wings for 2 additional minutes.   While looking in a mirror, press and release button in center of patch.  A  small green light will flash 3-4 times .  This will be your only indicator the monitor has been turned on.     Do not shower for the first 24 hours.  You may shower after the first 24 hours.   Press button if you feel a symptom. You will hear a small click.  Record Date, Time and Symptom in the Patient Log Book.   When you are ready to remove patch, follow instructions on last 2 pages of Patient Log Book.  Stick patch monitor onto last page of Patient Log Book.   Place Patient Log Book in [place] box.  Use locking tab on box and tape box closed securely.  The [employer] and [employer] has [employer] on it.  Please place in mailbox as soon as possible.  Your physician should have your test results approximately 7 days after the monitor has been mailed back to [facility].   Call [place] at [phone] if you have questions regarding your ZIO XT patch monitor.  Call them immediately if you see an orange light blinking on your monitor.   If your monitor falls off in less than 4 days contact our Monitor department at [phone].  If your monitor becomes loose or falls off after 4 days call [employer] at [phone] for suggestions on securing your monitor.      Follow-Up: At [facility], you and your health needs are our priority.  As part of our continuing mission to provide you with exceptional heart care, we have created designated Provider Care Teams.  These Care Teams include your primary Cardiologist (physician) and Advanced Practice Providers (APPs -  Physician Assistants and Nurse Practitioners) who all work together to provide you with the care you need, when you need it.  We recommend signing up for the patient portal called "MyChart".  Sign up information is provided on this After Visit Summary.  MyChart is used to connect with patients for Virtual Visits (Telemedicine).  Patients are able to view lab/test results, encounter notes, upcoming appointments, etc.   Non-urgent messages can be sent to your provider as well.   To learn more about what you can do with MyChart, go to [email or web address].    Your next appointment:   4-6 week(s) - after monitor  The format for your next appointment:   In Person  Provider:   You may see Dr. Rennis Golden or one of the following Advanced Practice Providers on your designated Care Team:    Azalee Course, PA-C  Micah Flesher, [place] or   Judy Pimple, [place]    Other Instructions

## 2019-06-11 ENCOUNTER — Encounter: Payer: Self-pay | Admitting: Internal Medicine

## 2019-06-11 NOTE — Progress Notes (Signed)
OFFICE CONSULT NOTE  Chief Complaint:  Tachycardia/palpitations  Primary Care Physician: Maximiano Coss, NP  HPI:  Jesse Houston is a 39 y.o. male who is being seen today for the evaluation of tachycardia/palpitations at the request of Maximiano Coss, NP.  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  About 4 weeks ago he noticed onset of palpitations and tachycardia, specifically he noted elevated heart rate sometimes in the evening as well as with certain activities but could also occur at rest.  He try to cut out caffeine and other offending agents which had no effect on it.  He reports no stress or any undue anxiety.  In general his sleep is pretty good.  He is of normal weight and normal blood pressure and only takes atorvastatin, fish oil and some meloxicam as needed.  EKG today shows a sinus rhythm with some lead artifact.  Voltage is high however he is a thin man.  No heart disease is noted in the family.  PMHx:  Past Medical History:  Diagnosis Date  . Allergy   . Asthma   . Diverticulosis   . Diverticulosis   . Low testosterone     Past Surgical History:  Procedure Laterality Date  . COLONOSCOPY N/A 11/06/2017   Procedure: COLONOSCOPY;  Surgeon: Leighton Ruff, MD;  Location: [facility];  Service: Endoscopy;  Laterality: N/A;  . COLONOSCOPY  11/06/2017   tics   . VASECTOMY    . WISDOM TOOTH EXTRACTION      FAMHx:  Family History  Problem Relation Age of Onset  . Arthritis Mother   . Brain cancer Son   . Arthritis Maternal Grandmother   . Diabetes Maternal Grandfather   . Uterine cancer Paternal Grandmother   . Cirrhosis Paternal Grandfather   . Diabetes Maternal Aunt   . Colon cancer Neg Hx   . Esophageal cancer Neg Hx   . Inflammatory bowel disease Neg Hx   . Liver disease Neg Hx   . Pancreatic cancer Neg Hx   . Rectal cancer Neg Hx   . Stomach cancer Neg Hx   . Colon polyps Neg  Hx     SOCHx:   reports that he has never smoked. He has never used smokeless tobacco. He reports current alcohol use of about 2.0 - 3.0 standard drinks of alcohol per week. He reports that he does not use drugs.  ALLERGIES:  No Known Allergies  ROS: Pertinent items noted in HPI and remainder of comprehensive ROS otherwise negative.  HOME MEDS: Current Outpatient Medications on File Prior to Visit  Medication Sig Dispense Refill  . atorvastatin (LIPITOR) 20 MG tablet Take 1 tablet (20 mg total) by mouth daily. 90 tablet 3  . meloxicam (MOBIC) 15 MG tablet Take 1 tablet (15 mg total) by mouth daily. 14 tablet 0  . Multiple Vitamin (MULTIVITAMIN) tablet Take 1 tablet by mouth daily.    . Omega-3 Fatty Acids (FISH OIL) 1000 MG CAPS Take 2 capsules by mouth daily.     No current facility-administered medications on file prior to visit.    LABS/IMAGING: No results found for this or any previous visit (from the past 48 hour(s)). No results found.  LIPID PANEL:    Component Value Date/Time   CHOL 219 (H) 05/18/2019 0906   TRIG 95 05/18/2019 0906   HDL 48 05/18/2019 0906   CHOLHDL 4.6 05/18/2019 0906   CHOLHDL 3.7 07/08/2014 8182  VLDL 17 07/08/2014 0927   LDLCALC 154 (H) 05/18/2019 0906    WEIGHTS: Wt Readings from Last 3 Encounters:  06/10/19 180 lb (81.6 kg)  06/07/19 177 lb 12.8 oz (80.6 kg)  05/18/19 179 lb 12.8 oz (81.6 kg)    VITALS: BP 102/72   Pulse 98   Ht 6\' 3"  (1.905 m)   Wt 180 lb (81.6 kg)   SpO2 97%   BMI 22.50 kg/m   EXAM: General appearance: alert, no distress and Thin and tall Neck: no carotid bruit, no JVD and thyroid not enlarged, symmetric, no tenderness/mass/nodules Lungs: clear to auscultation bilaterally Heart: regular rate and rhythm, S1, S2 normal, no murmur, click, rub or gallop Abdomen: soft, non-tender; bowel sounds normal; no masses,  no organomegaly Extremities: extremities normal, atraumatic, no cyanosis or edema Pulses: 2+ and  symmetric Skin: Skin color, texture, turgor normal. No rashes or lesions Neurologic: Grossly normal Psych: Pleasant  EKG: Normal sinus rhythm at 62-personally reviewed  ASSESSMENT: 1. Palpitations/tachycardia  PLAN: 1.   [name] is describing palpitations/tachycardia which would not necessarily associate with exertion or relieved by rest but can occur in an unprovoked fashion.  I think would benefit from a 2-week monitor to see exactly what his symptoms are having and correlated to a symptom diary.  We could consider treatment based on ultimate findings.  Plan follow-up with me afterwards.  Thanks again for the consultation.  Robb Matar, MD, [facility], FACP  [place]  [facility]  Medical Director of the Advanced Lipid Disorders &  Cardiovascular Risk Reduction Clinic Diplomate of the American Board of Clinical Lipidology Attending Cardiologist  Direct Dial: [phone]  Fax: [phone]  Website:  [email or web address] 06/11/2019, 11:34 PM

## 2019-06-16 ENCOUNTER — Other Ambulatory Visit (INDEPENDENT_AMBULATORY_CARE_PROVIDER_SITE_OTHER): Payer: BC Managed Care – PPO

## 2019-06-16 DIAGNOSIS — R002 Palpitations: Secondary | ICD-10-CM

## 2019-07-14 DIAGNOSIS — R002 Palpitations: Secondary | ICD-10-CM | POA: Diagnosis not present

## 2019-07-29 ENCOUNTER — Encounter: Payer: Self-pay | Admitting: Internal Medicine

## 2020-03-22 DIAGNOSIS — H52223 Regular astigmatism, bilateral: Secondary | ICD-10-CM | POA: Diagnosis not present

## 2020-06-07 ENCOUNTER — Encounter: Payer: BC Managed Care – PPO | Admitting: Registered Nurse

## 2020-06-09 DIAGNOSIS — Z87898 Personal history of other specified conditions: Secondary | ICD-10-CM | POA: Diagnosis not present

## 2020-06-09 DIAGNOSIS — Z0000 Encounter for general adult medical examination without abnormal findings: Secondary | ICD-10-CM | POA: Diagnosis not present

## 2021-04-27 ENCOUNTER — Telehealth: Payer: Self-pay

## 2021-04-27 NOTE — Telephone Encounter (Signed)
Pt is calling requesting TOC due to distance. I advised pt I would have to get the approval and call back to schedule. Pt requested Dr. Mitchel Honour advised that his schedule is out to the end of May.  Please Advise.

## 2021-04-27 NOTE — Telephone Encounter (Signed)
OK with TOC 

## 2021-04-30 NOTE — Telephone Encounter (Signed)
Ok by me! Thanks,  Rich

## 2021-05-01 DIAGNOSIS — H52223 Regular astigmatism, bilateral: Secondary | ICD-10-CM | POA: Diagnosis not present

## 2021-05-17 ENCOUNTER — Other Ambulatory Visit: Payer: Self-pay

## 2021-05-17 ENCOUNTER — Ambulatory Visit (INDEPENDENT_AMBULATORY_CARE_PROVIDER_SITE_OTHER): Payer: BC Managed Care – PPO | Admitting: Registered Nurse

## 2021-05-17 ENCOUNTER — Encounter: Payer: Self-pay | Admitting: Registered Nurse

## 2021-05-17 VITALS — BP 118/64 | HR 80 | Temp 98.0°F | Resp 18 | Ht 75.0 in | Wt 198.0 lb

## 2021-05-17 DIAGNOSIS — Z130 Encounter for screening for diseases of the blood and blood-forming organs and certain disorders involving the immune mechanism: Secondary | ICD-10-CM | POA: Diagnosis not present

## 2021-05-17 DIAGNOSIS — Z13228 Encounter for screening for other metabolic disorders: Secondary | ICD-10-CM | POA: Diagnosis not present

## 2021-05-17 DIAGNOSIS — Z0000 Encounter for general adult medical examination without abnormal findings: Secondary | ICD-10-CM

## 2021-05-17 DIAGNOSIS — Z13220 Encounter for screening for lipoid disorders: Secondary | ICD-10-CM

## 2021-05-17 DIAGNOSIS — Z1329 Encounter for screening for other suspected endocrine disorder: Secondary | ICD-10-CM

## 2021-05-17 NOTE — Patient Instructions (Addendum)
Mr. Jesse Houston - ? ?Great to see you! ? ?Normal exam ? ?I'll call if labs are worrisome. ? ?Call me if you need anything ? ?Otherwise, see you in a year! ? ?Thanks, ? ?Rich  ? ? ? ?If you have lab work done today you will be contacted with your lab results within the next 2 weeks.  If you have not heard from [place] then please contact us. The fastest way to get your results is to register for My Chart. ? ? ?IF you received an x-ray today, you will receive an invoice from [facility]. Please contact [facility] at [phone] with questions or concerns regarding your invoice.  ? ?IF you received labwork today, you will receive an invoice from [place]. Please contact [facility] at [phone] with questions or concerns regarding your invoice.  ? ?Our billing staff will not be able to assist you with questions regarding bills from these companies. ? ?You will be contacted with the lab results as soon as they are available. The fastest way to get your results is to activate your My Chart account. Instructions are located on the last page of this paperwork. If you have not heard from [place] regarding the results in 2 weeks, please contact this office. ?  ? ? ?

## 2021-05-17 NOTE — Progress Notes (Signed)
? ?Established Patient Office Visit ? ?Subjective:  ?Patient ID: Jesse Houston, male    DOB: 10/14/1980  Age: 41 y.o. MRN: [id number] ? ?CC:  ?Chief Complaint  ?Patient presents with  ? Annual Exam  ? ? ?HPI ?Jesse Houston presents for CPE  ? ?No acute concerns ? ?Histories reviewed and updated with patient.  ? ? ?Past Medical History:  ?Diagnosis Date  ? Allergy   ? Asthma   ? Diverticulosis   ? Diverticulosis   ? Low testosterone   ? ? ?Past Surgical History:  ?Procedure Laterality Date  ? COLONOSCOPY N/A 11/06/2017  ? Procedure: COLONOSCOPY;  Surgeon: Leighton Ruff, MD;  Location: [facility];  Service: Endoscopy;  Laterality: N/A;  ? COLONOSCOPY  11/06/2017  ? tics   ? VASECTOMY    ? WISDOM TOOTH EXTRACTION    ? ? ?Family History  ?Problem Relation Age of Onset  ? Arthritis Mother   ? Brain cancer Son   ? Arthritis Maternal Grandmother   ? Diabetes Maternal Grandfather   ? Uterine cancer Paternal Grandmother   ? Cirrhosis Paternal Grandfather   ? Diabetes Maternal Aunt   ? Colon cancer Neg Hx   ? Esophageal cancer Neg Hx   ? Inflammatory bowel disease Neg Hx   ? Liver disease Neg Hx   ? Pancreatic cancer Neg Hx   ? Rectal cancer Neg Hx   ? Stomach cancer Neg Hx   ? Colon polyps Neg Hx   ? ? ?Social History  ? ?Socioeconomic History  ? Marital status: Married  ?  Spouse name: Not on file  ? Number of children: 1  ? Years of education: Not on file  ? Highest education level: Not on file  ?Occupational History  ? Occupation: Superintendent  ?Tobacco Use  ? Smoking status: Never  ? Smokeless tobacco: Never  ?Vaping Use  ? Vaping Use: Never used  ?Substance and Sexual Activity  ? Alcohol use: Yes  ?  Alcohol/week: 2.0 - 3.0 standard drinks  ?  Types: 2 - 3 Standard drinks or equivalent per week  ?  Comment: social   ? Drug use: No  ? Sexual activity: Yes  ?  Birth control/protection: Condom  ?Other Topics Concern  ? Not on file  ?Social History Narrative  ? Not on file  ? ?Social Determinants of Health  ? ?Financial  Resource Strain: Not on file  ?Food Insecurity: Not on file  ?Transportation Needs: Not on file  ?Physical Activity: Not on file  ?Stress: Not on file  ?Social Connections: Not on file  ?Intimate Partner Violence: Not on file  ? ? ?Outpatient Medications Prior to Visit  ?Medication Sig Dispense Refill  ? Multiple Vitamin (MULTIVITAMIN) tablet Take 1 tablet by mouth daily.    ? Omega-3 Fatty Acids (FISH OIL) 1000 MG CAPS Take 2 capsules by mouth daily.    ? atorvastatin (LIPITOR) 20 MG tablet Take 1 tablet (20 mg total) by mouth daily. (Patient not taking: Reported on 05/17/2021) 90 tablet 3  ? ?No facility-administered medications prior to visit.  ? ? ?No Known Allergies ? ?ROS ?Review of Systems  ?Constitutional: Negative.   ?HENT: Negative.    ?Eyes: Negative.   ?Respiratory: Negative.    ?Cardiovascular: Negative.   ?Gastrointestinal: Negative.   ?Genitourinary: Negative.   ?Musculoskeletal: Negative.   ?Skin: Negative.   ?Neurological: Negative.   ?Psychiatric/Behavioral: Negative.    ?All other systems reviewed and are negative. ? ?  ?Objective:  ?  ?  Physical Exam ?Vitals and nursing note reviewed.  ?Constitutional:   ?   General: He is not in acute distress. ?   Appearance: Normal appearance. He is normal weight. He is not ill-appearing, toxic-appearing or diaphoretic.  ?HENT:  ?   Head: Normocephalic and atraumatic.  ?   Right Ear: Tympanic membrane, ear canal and external ear normal. There is no impacted cerumen.  ?   Left Ear: Tympanic membrane, ear canal and external ear normal. There is no impacted cerumen.  ?   Nose: Nose normal. No congestion or rhinorrhea.  ?   Mouth/Throat:  ?   Mouth: Mucous membranes are moist.  ?   Pharynx: Oropharynx is clear. No oropharyngeal exudate or posterior oropharyngeal erythema.  ?Eyes:  ?   General: No scleral icterus.    ?   Right eye: No discharge.     ?   Left eye: No discharge.  ?   Extraocular Movements: Extraocular movements intact.  ?   Conjunctiva/sclera:  Conjunctivae normal.  ?   Pupils: Pupils are equal, round, and reactive to light.  ?Neck:  ?   Vascular: No carotid bruit.  ?Cardiovascular:  ?   Rate and Rhythm: Normal rate and regular rhythm.  ?   Pulses: Normal pulses.  ?   Heart sounds: Normal heart sounds. No murmur heard. ?  No friction rub. No gallop.  ?Pulmonary:  ?   Effort: Pulmonary effort is normal. No respiratory distress.  ?   Breath sounds: Normal breath sounds. No stridor. No wheezing, rhonchi or rales.  ?Chest:  ?   Chest wall: No tenderness.  ?Abdominal:  ?   General: Abdomen is flat. Bowel sounds are normal. There is no distension.  ?   Palpations: Abdomen is soft. There is no mass.  ?   Tenderness: There is no abdominal tenderness. There is no right CVA tenderness, left CVA tenderness, guarding or rebound.  ?   Hernia: No hernia is present.  ?Musculoskeletal:     ?   General: No swelling, tenderness, deformity or signs of injury. Normal range of motion.  ?   Cervical back: Normal range of motion and neck supple. No rigidity or tenderness.  ?   Right lower leg: No edema.  ?   Left lower leg: No edema.  ?Lymphadenopathy:  ?   Cervical: No cervical adenopathy.  ?Skin: ?   General: Skin is warm and dry.  ?   Capillary Refill: Capillary refill takes less than 2 seconds.  ?   Coloration: Skin is not jaundiced or pale.  ?   Findings: No bruising, erythema, lesion or rash.  ?Neurological:  ?   General: No focal deficit present.  ?   Mental Status: He is alert and oriented to person, place, and time. Mental status is at baseline.  ?   Cranial Nerves: No cranial nerve deficit.  ?   Motor: No weakness.  ?   Gait: Gait normal.  ?Psychiatric:     ?   Mood and Affect: Mood normal.     ?   Behavior: Behavior normal.     ?   Thought Content: Thought content normal.     ?   Judgment: Judgment normal.  ? ? ?BP 118/64   Pulse 80   Temp 98 ?F (36.7 ?C) (Temporal)   Resp 18   Ht 6\' 3"  (1.905 m)   Wt 198 lb (89.8 kg)   BMI 24.75 kg/m?  ?Wt Readings from Last 3  Encounters:  ?05/17/21 198 lb (89.8 kg)  ?06/10/19 180 lb (81.6 kg)  ?06/07/19 177 lb 12.8 oz (80.6 kg)  ? ? ? ?Health Maintenance Due  ?Topic Date Due  ? COVID-19 Vaccine (1) Never done  ? Hepatitis C Screening  Never done  ? ? ?There are no preventive care reminders to display for this patient. ? ?Lab Results  ?Component Value Date  ? TSH 1.190 05/18/2019  ? ?Lab Results  ?Component Value Date  ? WBC 6.7 05/18/2019  ? HGB 16.4 05/18/2019  ? HCT 48.3 05/18/2019  ? MCV 87 05/18/2019  ? PLT 362 02/12/2019  ? ?Lab Results  ?Component Value Date  ? NA 138 05/18/2019  ? K 4.3 05/18/2019  ? CO2 23 05/18/2019  ? GLUCOSE 91 05/18/2019  ? BUN 15 05/18/2019  ? CREATININE 0.97 05/18/2019  ? BILITOT 0.8 05/18/2019  ? ALKPHOS 87 05/18/2019  ? AST 30 05/18/2019  ? ALT 18 05/18/2019  ? PROT 7.2 05/18/2019  ? ALBUMIN 4.8 05/18/2019  ? CALCIUM 9.4 05/18/2019  ? ANIONGAP 6 08/17/2017  ? ?Lab Results  ?Component Value Date  ? CHOL 219 (H) 05/18/2019  ? ?Lab Results  ?Component Value Date  ? HDL 48 05/18/2019  ? ?Lab Results  ?Component Value Date  ? LDLCALC 154 (H) 05/18/2019  ? ?Lab Results  ?Component Value Date  ? TRIG 95 05/18/2019  ? ?Lab Results  ?Component Value Date  ? CHOLHDL 4.6 05/18/2019  ? ?Lab Results  ?Component Value Date  ? HGBA1C 5.1 04/18/2017  ? ? ?  ?Assessment & Plan:  ? ?Problem List Items Addressed This Visit   ?None ?Visit Diagnoses   ? ? Annual physical exam    -  Primary  ? Screening for endocrine, metabolic and immunity disorder      ? Relevant Orders  ? CBC with Differential/Platelet  ? Comprehensive metabolic panel  ? Hemoglobin A1c  ? TSH  ? Lipid screening      ? Relevant Orders  ? Lipid panel  ? ?  ? ? ?No orders of the defined types were placed in this encounter. ? ? ?Follow-up: Return in about 1 year (around 05/18/2022) for CPE and labs.  ? ?PLAN ?Exam unremarkable ?Labs collected. Will follow up with the patient as warranted. ?Patient encouraged to call clinic with any questions, comments, or  concerns. ? ?Maximiano Coss, NP ?

## 2021-05-18 LAB — COMPREHENSIVE METABOLIC PANEL
ALT: 21 U/L (ref 0–53)
AST: 23 U/L (ref 0–37)
Albumin: 4.4 g/dL (ref 3.5–5.2)
Alkaline Phosphatase: 76 U/L (ref 39–117)
BUN: 27 mg/dL — ABNORMAL HIGH (ref 6–23)
CO2: 28 mEq/L (ref 19–32)
Calcium: 9 mg/dL (ref 8.4–10.5)
Chloride: 100 mEq/L (ref 96–112)
Creatinine, Ser: 1.27 mg/dL (ref 0.40–1.50)
GFR: 70.39 mL/min (ref >60.00)
Glucose, Bld: 81 mg/dL (ref 70–99)
Potassium: 4 mEq/L (ref 3.5–5.1)
Sodium: 137 mEq/L (ref 135–145)
Total Bilirubin: 0.5 mg/dL (ref 0.2–1.2)
Total Protein: 6.6 g/dL (ref 6.0–8.3)

## 2021-05-18 LAB — LIPID PANEL
Cholesterol: 203 mg/dL — ABNORMAL HIGH (ref 0–200)
HDL: 31.6 mg/dL — ABNORMAL LOW (ref >39.00)
Total CHOL/HDL Ratio: 6
Triglycerides: 481 mg/dL — ABNORMAL HIGH (ref 0.0–149.0)

## 2021-05-18 LAB — CBC WITH DIFFERENTIAL/PLATELET
Basophils Absolute: 0.1 10*3/uL (ref 0.0–0.1)
Basophils Relative: 0.6 % (ref 0.0–3.0)
Eosinophils Absolute: 0.5 10*3/uL (ref 0.0–0.7)
Eosinophils Relative: 5.9 % — ABNORMAL HIGH (ref 0.0–5.0)
HCT: 42.3 % (ref 39.0–52.0)
Hemoglobin: 14.8 g/dL (ref 13.0–17.0)
Lymphocytes Relative: 26.3 % (ref 12.0–46.0)
Lymphs Abs: 2.4 10*3/uL (ref 0.7–4.0)
MCHC: 34.9 g/dL (ref 30.0–36.0)
MCV: 86.1 fl (ref 78.0–100.0)
Monocytes Absolute: 0.6 10*3/uL (ref 0.1–1.0)
Monocytes Relative: 6.5 % (ref 3.0–12.0)
Neutro Abs: 5.5 10*3/uL (ref 1.4–7.7)
Neutrophils Relative %: 60.7 % (ref 43.0–77.0)
Platelets: 344 10*3/uL (ref 150.0–400.0)
RBC: 4.92 Mil/uL (ref 4.22–5.81)
RDW: 13 % (ref 11.5–15.5)
WBC: 9 10*3/uL (ref 4.0–10.5)

## 2021-05-18 LAB — LDL CHOLESTEROL, DIRECT: Direct LDL: 116 mg/dL

## 2021-05-18 LAB — HEMOGLOBIN A1C: Hgb A1c MFr Bld: 5.2 % (ref 4.6–6.5)

## 2021-05-18 LAB — TSH: TSH: 1.14 u[IU]/mL (ref 0.35–5.50)

## 2021-05-21 ENCOUNTER — Encounter: Payer: Self-pay | Admitting: Registered Nurse

## 2021-07-14 DIAGNOSIS — R062 Wheezing: Secondary | ICD-10-CM | POA: Diagnosis not present

## 2021-07-14 DIAGNOSIS — R051 Acute cough: Secondary | ICD-10-CM | POA: Diagnosis not present

## 2021-07-14 DIAGNOSIS — J209 Acute bronchitis, unspecified: Secondary | ICD-10-CM | POA: Diagnosis not present

## 2021-08-14 ENCOUNTER — Encounter: Payer: Self-pay | Admitting: Emergency Medicine

## 2021-08-14 ENCOUNTER — Ambulatory Visit (INDEPENDENT_AMBULATORY_CARE_PROVIDER_SITE_OTHER): Payer: BC Managed Care – PPO | Admitting: Emergency Medicine

## 2021-08-14 VITALS — BP 116/78 | HR 70 | Temp 98.2°F | Ht 75.0 in | Wt 188.0 lb

## 2021-08-14 DIAGNOSIS — Z8719 Personal history of other diseases of the digestive system: Secondary | ICD-10-CM

## 2021-08-14 DIAGNOSIS — Z0000 Encounter for general adult medical examination without abnormal findings: Secondary | ICD-10-CM | POA: Diagnosis not present

## 2021-08-14 DIAGNOSIS — Z7689 Persons encountering health services in other specified circumstances: Secondary | ICD-10-CM | POA: Diagnosis not present

## 2021-08-14 NOTE — Progress Notes (Signed)
Jesse Houston 41 y.o.   Chief Complaint  Patient presents with   transfer of care    HISTORY OF PRESENT ILLNESS: This is a 41 y.o. male former patient of Janeece Agee here to establish care with me. Has history of diverticulosis with diverticulitis in the past. Recent blood work showed high triglycerides.  Otherwise normal. Healthy lifestyle.  HPI   Prior to Admission medications   Medication Sig Start Date End Date Taking? Authorizing Provider  Multiple Vitamin (MULTIVITAMIN) tablet Take 1 tablet by mouth daily.   Yes [provider]  Omega-3 Fatty Acids (FISH OIL) 1000 MG CAPS Take 2 capsules by mouth daily.   Yes [provider]  atorvastatin (LIPITOR) 20 MG tablet Take 1 tablet (20 mg total) by mouth daily. Patient not taking: Reported on 05/17/2021 05/19/19   Janeece Agee, NP    No Known Allergies  Patient Active Problem List   Diagnosis Date Noted   History of diverticulitis 05/07/2018    Past Medical History:  Diagnosis Date   Allergy    Asthma    Diverticulosis    Diverticulosis    Low testosterone     Past Surgical History:  Procedure Laterality Date   COLONOSCOPY N/A 11/06/2017   Procedure: COLONOSCOPY;  Surgeon: Romie Levee, MD;  Location: [facility];  Service: Endoscopy;  Laterality: N/A;   COLONOSCOPY  11/06/2017   tics    VASECTOMY     WISDOM TOOTH EXTRACTION      Social History   Socioeconomic History   Marital status: Married    Spouse name: Not on file   Number of children: 1   Years of education: Not on file   Highest education level: Not on file  Occupational History   Occupation: Superintendent  Tobacco Use   Smoking status: Never   Smokeless tobacco: Never  Vaping Use   Vaping Use: Never used  Substance and Sexual Activity   Alcohol use: Yes    Alcohol/week: 2.0 - 3.0 standard drinks    Types: 2 - 3 Standard drinks or equivalent per week    Comment: social    Drug use: No   Sexual activity: Yes     Birth control/protection: Condom  Other Topics Concern   Not on file  Social History Narrative   Not on file   Social Determinants of Health   Financial Resource Strain: Not on file  Food Insecurity: Not on file  Transportation Needs: Not on file  Physical Activity: Not on file  Stress: Not on file  Social Connections: Not on file  Intimate Partner Violence: Not on file    Family History  Problem Relation Age of Onset   Arthritis Mother    Brain cancer Son    Arthritis Maternal Grandmother    Diabetes Maternal Grandfather    Uterine cancer Paternal Grandmother    Cirrhosis Paternal Grandfather    Diabetes Maternal Aunt    Colon cancer Neg Hx    Esophageal cancer Neg Hx    Inflammatory bowel disease Neg Hx    Liver disease Neg Hx    Pancreatic cancer Neg Hx    Rectal cancer Neg Hx    Stomach cancer Neg Hx    Colon polyps Neg Hx      Review of Systems  Constitutional: Negative.  Negative for chills and fever.  HENT: Negative.  Negative for congestion and sore throat.   Eyes: Negative.   Respiratory: Negative.  Negative for cough and shortness of breath.  Cardiovascular: Negative.  Negative for chest pain and palpitations.  Gastrointestinal:  Negative for abdominal pain, diarrhea, nausea and vomiting.  Genitourinary: Negative.   Skin: Negative.  Negative for rash.  Neurological: Negative.  Negative for dizziness and headaches.  All other systems reviewed and are negative. Today's Vitals   08/14/21 1347  BP: 116/78  Pulse: 70  Temp: 98.2 F (36.8 C)  TempSrc: Oral  SpO2: 94%  Weight: 188 lb (85.3 kg)  Height: 6\' 3"  (1.905 m)   Body mass index is 23.5 kg/m.   Physical Exam Vitals reviewed.  Constitutional:      Appearance: Normal appearance.  HENT:     Head: Normocephalic.     Right Ear: Tympanic membrane, ear canal and external ear normal.     Left Ear: Tympanic membrane, ear canal and external ear normal.     Mouth/Throat:     Mouth: Mucous  membranes are moist.     Pharynx: Oropharynx is clear.  Eyes:     Extraocular Movements: Extraocular movements intact.     Conjunctiva/sclera: Conjunctivae normal.     Pupils: Pupils are equal, round, and reactive to light.  Cardiovascular:     Rate and Rhythm: Normal rate and regular rhythm.     Pulses: Normal pulses.     Heart sounds: Normal heart sounds.  Pulmonary:     Effort: Pulmonary effort is normal.     Breath sounds: Normal breath sounds.  Abdominal:     General: There is no distension.     Palpations: Abdomen is soft.     Tenderness: There is no abdominal tenderness.  Musculoskeletal:        General: Normal range of motion.     Cervical back: No tenderness.  Lymphadenopathy:     Cervical: No cervical adenopathy.  Skin:    General: Skin is warm and dry.     Capillary Refill: Capillary refill takes less than 2 seconds.  Neurological:     General: No focal deficit present.     Mental Status: He is alert and oriented to person, place, and time.  Psychiatric:        Mood and Affect: Mood normal.        Behavior: Behavior normal.     ASSESSMENT & PLAN: Problem List Items Addressed This Visit   None Visit Diagnoses     Routine general medical examination at a health care facility    -  Primary   History of diverticulosis       Encounter to establish care          Modifiable risk factors discussed with patient. Anticipatory guidance according to age provided. The following topics were also discussed: Social Determinants of Health Smoking.  Non-smoker Diet and nutrition and need to decrease amount of daily carbohydrate intake Management of high triglycerides with diet and exercise Benefits of exercise Cancer family history review Vaccinations recommendations Cardiovascular risk assessment The 10-year ASCVD risk score (Arnett DK, et al., 2019) is: 2.1%   Values used to calculate the score:     Age: 75 years     Sex: Male     Is Non-Hispanic African  American: No     Diabetic: No     Tobacco smoker: No     Systolic Blood Pressure: 116 mmHg     Is BP treated: No     HDL Cholesterol: 31.6 mg/dL     Total Cholesterol: 203 mg/dL  Mental health including depression and anxiety Fall and  accident prevention  Patient Instructions  Health Maintenance, Male Adopting a healthy lifestyle and getting preventive care are important in promoting health and wellness. Ask your health care provider about: The right schedule for you to have regular tests and exams. Things you can do on your own to prevent diseases and keep yourself healthy. What should I know about diet, weight, and exercise? Eat a healthy diet  Eat a diet that includes plenty of vegetables, fruits, low-fat dairy products, and lean protein. Do not eat a lot of foods that are high in solid fats, added sugars, or sodium. Maintain a healthy weight Body mass index (BMI) is a measurement that can be used to identify possible weight problems. It estimates body fat based on height and weight. Your health care provider can help determine your BMI and help you achieve or maintain a healthy weight. Get regular exercise Get regular exercise. This is one of the most important things you can do for your health. Most adults should: Exercise for at least 150 minutes each week. The exercise should increase your heart rate and make you sweat (moderate-intensity exercise). Do strengthening exercises at least twice a week. This is in addition to the moderate-intensity exercise. Spend less time sitting. Even light physical activity can be beneficial. Watch cholesterol and blood lipids Have your blood tested for lipids and cholesterol at 41 years of age, then have this test every 5 years. You may need to have your cholesterol levels checked more often if: Your lipid or cholesterol levels are high. You are older than 41 years of age. You are at high risk for heart disease. What should I know about  cancer screening? Many types of cancers can be detected early and may often be prevented. Depending on your health history and family history, you may need to have cancer screening at various ages. This may include screening for: Colorectal cancer. Prostate cancer. Skin cancer. Lung cancer. What should I know about heart disease, diabetes, and high blood pressure? Blood pressure and heart disease High blood pressure causes heart disease and increases the risk of stroke. This is more likely to develop in people who have high blood pressure readings or are overweight. Talk with your health care provider about your target blood pressure readings. Have your blood pressure checked: Every 3-5 years if you are 45-50 years of age. Every year if you are 39 years old or older. If you are between the ages of 74 and 41 and are a current or former smoker, ask your health care provider if you should have a one-time screening for abdominal aortic aneurysm (AAA). Diabetes Have regular diabetes screenings. This checks your fasting blood sugar level. Have the screening done: Once every three years after age 46 if you are at a normal weight and have a low risk for diabetes. More often and at a younger age if you are overweight or have a high risk for diabetes. What should I know about preventing infection? Hepatitis B If you have a higher risk for hepatitis B, you should be screened for this virus. Talk with your health care provider to find out if you are at risk for hepatitis B infection. Hepatitis C Blood testing is recommended for: Everyone born from [birth year] through 1965. Anyone with known risk factors for hepatitis C. Sexually transmitted infections (STIs) You should be screened each year for STIs, including gonorrhea and chlamydia, if: You are sexually active and are younger than 41 years of age. You are older than  41 years of age and your health care provider tells you that you are at risk for this type  of infection. Your sexual activity has changed since you were last screened, and you are at increased risk for chlamydia or gonorrhea. Ask your health care provider if you are at risk. Ask your health care provider about whether you are at high risk for HIV. Your health care provider may recommend a prescription medicine to help prevent HIV infection. If you choose to take medicine to prevent HIV, you should first get tested for HIV. You should then be tested every 3 months for as long as you are taking the medicine. Follow these instructions at home: Alcohol use Do not drink alcohol if your health care provider tells you not to drink. If you drink alcohol: Limit how much you have to 0-2 drinks a day. Know how much alcohol is in your drink. In the [place] one drink equals one 12 oz bottle of beer (355 mL), one 5 oz glass of wine (148 mL), or one 1 oz glass of hard liquor (44 mL). Lifestyle Do not use any products that contain nicotine or tobacco. These products include cigarettes, chewing tobacco, and vaping devices, such as e-cigarettes. If you need help quitting, ask your health care provider. Do not use street drugs. Do not share needles. Ask your health care provider for help if you need support or information about quitting drugs. General instructions Schedule regular health, dental, and eye exams. Stay current with your vaccines. Tell your health care provider if: You often feel depressed. You have ever been abused or do not feel safe at home. Summary Adopting a healthy lifestyle and getting preventive care are important in promoting health and wellness. Follow your health care provider's instructions about healthy diet, exercising, and getting tested or screened for diseases. Follow your health care provider's instructions on monitoring your cholesterol and blood pressure. This information is not intended to replace advice given to you by your health care provider. Make sure you discuss  any questions you have with your health care provider. Document Revised: 07/24/2020 Document Reviewed: 07/24/2020 Elsevier Patient Education  2023 [employer]     Edwina BarthMiguel Towanna Avery, MD [facility]

## 2021-08-14 NOTE — Patient Instructions (Signed)
Health Maintenance, Male Adopting a healthy lifestyle and getting preventive care are important in promoting health and wellness. Ask your health care provider about: The right schedule for you to have regular tests and exams. Things you can do on your own to prevent diseases and keep yourself healthy. What should I know about diet, weight, and exercise? Eat a healthy diet  Eat a diet that includes plenty of vegetables, fruits, low-fat dairy products, and lean protein. Do not eat a lot of foods that are high in solid fats, added sugars, or sodium. Maintain a healthy weight Body mass index (BMI) is a measurement that can be used to identify possible weight problems. It estimates body fat based on height and weight. Your health care provider can help determine your BMI and help you achieve or maintain a healthy weight. Get regular exercise Get regular exercise. This is one of the most important things you can do for your health. Most adults should: Exercise for at least 150 minutes each week. The exercise should increase your heart rate and make you sweat (moderate-intensity exercise). Do strengthening exercises at least twice a week. This is in addition to the moderate-intensity exercise. Spend less time sitting. Even light physical activity can be beneficial. Watch cholesterol and blood lipids Have your blood tested for lipids and cholesterol at 41 years of age, then have this test every 5 years. You may need to have your cholesterol levels checked more often if: Your lipid or cholesterol levels are high. You are older than 40 years of age. You are at high risk for heart disease. What should I know about cancer screening? Many types of cancers can be detected early and may often be prevented. Depending on your health history and family history, you may need to have cancer screening at various ages. This may include screening for: Colorectal cancer. Prostate cancer. Skin cancer. Lung  cancer. What should I know about heart disease, diabetes, and high blood pressure? Blood pressure and heart disease High blood pressure causes heart disease and increases the risk of stroke. This is more likely to develop in people who have high blood pressure readings or are overweight. Talk with your health care provider about your target blood pressure readings. Have your blood pressure checked: Every 3-5 years if you are 18-39 years of age. Every year if you are 40 years old or older. If you are between the ages of 65 and 75 and are a current or former smoker, ask your health care provider if you should have a one-time screening for abdominal aortic aneurysm (AAA). Diabetes Have regular diabetes screenings. This checks your fasting blood sugar level. Have the screening done: Once every three years after age 45 if you are at a normal weight and have a low risk for diabetes. More often and at a younger age if you are overweight or have a high risk for diabetes. What should I know about preventing infection? Hepatitis B If you have a higher risk for hepatitis B, you should be screened for this virus. Talk with your health care provider to find out if you are at risk for hepatitis B infection. Hepatitis C Blood testing is recommended for: Everyone born from 1945 through 1965. Anyone with known risk factors for hepatitis C. Sexually transmitted infections (STIs) You should be screened each year for STIs, including gonorrhea and chlamydia, if: You are sexually active and are younger than 41 years of age. You are older than 41 years of age and your   health care provider tells you that you are at risk for this type of infection. Your sexual activity has changed since you were last screened, and you are at increased risk for chlamydia or gonorrhea. Ask your health care provider if you are at risk. Ask your health care provider about whether you are at high risk for HIV. Your health care provider  may recommend a prescription medicine to help prevent HIV infection. If you choose to take medicine to prevent HIV, you should first get tested for HIV. You should then be tested every 3 months for as long as you are taking the medicine. Follow these instructions at home: Alcohol use Do not drink alcohol if your health care provider tells you not to drink. If you drink alcohol: Limit how much you have to 0-2 drinks a day. Know how much alcohol is in your drink. In the [place] one drink equals one 12 oz bottle of beer (355 mL), one 5 oz glass of wine (148 mL), or one 1 oz glass of hard liquor (44 mL). Lifestyle Do not use any products that contain nicotine or tobacco. These products include cigarettes, chewing tobacco, and vaping devices, such as e-cigarettes. If you need help quitting, ask your health care provider. Do not use street drugs. Do not share needles. Ask your health care provider for help if you need support or information about quitting drugs. General instructions Schedule regular health, dental, and eye exams. Stay current with your vaccines. Tell your health care provider if: You often feel depressed. You have ever been abused or do not feel safe at home. Summary Adopting a healthy lifestyle and getting preventive care are important in promoting health and wellness. Follow your health care provider's instructions about healthy diet, exercising, and getting tested or screened for diseases. Follow your health care provider's instructions on monitoring your cholesterol and blood pressure. This information is not intended to replace advice given to you by your health care provider. Make sure you discuss any questions you have with your health care provider. Document Revised: 07/24/2020 Document Reviewed: 07/24/2020 Elsevier Patient Education  2023 Elsevier Inc.  

## 2021-09-06 DIAGNOSIS — Z20822 Contact with and (suspected) exposure to covid-19: Secondary | ICD-10-CM | POA: Diagnosis not present

## 2021-09-06 DIAGNOSIS — R051 Acute cough: Secondary | ICD-10-CM | POA: Diagnosis not present

## 2021-09-06 DIAGNOSIS — R519 Headache, unspecified: Secondary | ICD-10-CM | POA: Diagnosis not present

## 2021-09-06 DIAGNOSIS — R509 Fever, unspecified: Secondary | ICD-10-CM | POA: Diagnosis not present

## 2022-05-20 ENCOUNTER — Encounter: Payer: BC Managed Care – PPO | Admitting: Registered Nurse

## 2022-05-21 ENCOUNTER — Encounter: Payer: Self-pay | Admitting: Emergency Medicine

## 2022-05-21 ENCOUNTER — Ambulatory Visit (INDEPENDENT_AMBULATORY_CARE_PROVIDER_SITE_OTHER): Payer: BC Managed Care – PPO | Admitting: Emergency Medicine

## 2022-05-21 VITALS — BP 120/74 | HR 69 | Temp 98.4°F | Ht 75.0 in | Wt 197.1 lb

## 2022-05-21 DIAGNOSIS — Z13228 Encounter for screening for other metabolic disorders: Secondary | ICD-10-CM | POA: Diagnosis not present

## 2022-05-21 DIAGNOSIS — Z0000 Encounter for general adult medical examination without abnormal findings: Secondary | ICD-10-CM

## 2022-05-21 DIAGNOSIS — Z1159 Encounter for screening for other viral diseases: Secondary | ICD-10-CM

## 2022-05-21 DIAGNOSIS — Z13220 Encounter for screening for lipoid disorders: Secondary | ICD-10-CM

## 2022-05-21 DIAGNOSIS — Z130 Encounter for screening for diseases of the blood and blood-forming organs and certain disorders involving the immune mechanism: Secondary | ICD-10-CM | POA: Diagnosis not present

## 2022-05-21 DIAGNOSIS — Z1329 Encounter for screening for other suspected endocrine disorder: Secondary | ICD-10-CM

## 2022-05-21 LAB — CBC WITH DIFFERENTIAL/PLATELET
Basophils Absolute: 0.1 10*3/uL (ref 0.0–0.1)
Basophils Relative: 0.8 % (ref 0.0–3.0)
Eosinophils Absolute: 0.5 10*3/uL (ref 0.0–0.7)
Eosinophils Relative: 5.2 % — ABNORMAL HIGH (ref 0.0–5.0)
HCT: 42.9 % (ref 39.0–52.0)
Hemoglobin: 14.5 g/dL (ref 13.0–17.0)
Lymphocytes Relative: 30.2 % (ref 12.0–46.0)
Lymphs Abs: 2.7 10*3/uL (ref 0.7–4.0)
MCHC: 33.8 g/dL (ref 30.0–36.0)
MCV: 85.9 fl (ref 78.0–100.0)
Monocytes Absolute: 0.6 10*3/uL (ref 0.1–1.0)
Monocytes Relative: 6.3 % (ref 3.0–12.0)
Neutro Abs: 5.1 10*3/uL (ref 1.4–7.7)
Neutrophils Relative %: 57.5 % (ref 43.0–77.0)
Platelets: 341 10*3/uL (ref 150.0–400.0)
RBC: 5 Mil/uL (ref 4.22–5.81)
RDW: 13.1 % (ref 11.5–15.5)
WBC: 8.8 10*3/uL (ref 4.0–10.5)

## 2022-05-21 LAB — COMPREHENSIVE METABOLIC PANEL
ALT: 21 U/L (ref 0–53)
AST: 25 U/L (ref 0–37)
Albumin: 4.1 g/dL (ref 3.5–5.2)
Alkaline Phosphatase: 74 U/L (ref 39–117)
BUN: 24 mg/dL — ABNORMAL HIGH (ref 6–23)
CO2: 28 mEq/L (ref 19–32)
Calcium: 9.1 mg/dL (ref 8.4–10.5)
Chloride: 102 mEq/L (ref 96–112)
Creatinine, Ser: 0.87 mg/dL (ref 0.40–1.50)
GFR: 106.74 mL/min (ref >60.00)
Glucose, Bld: 88 mg/dL (ref 70–99)
Potassium: 3.7 mEq/L (ref 3.5–5.1)
Sodium: 137 mEq/L (ref 135–145)
Total Bilirubin: 0.5 mg/dL (ref 0.2–1.2)
Total Protein: 6.7 g/dL (ref 6.0–8.3)

## 2022-05-21 LAB — LIPID PANEL
Cholesterol: 197 mg/dL (ref 0–200)
HDL: 33.8 mg/dL — ABNORMAL LOW (ref >39.00)
NonHDL: 163.04
Total CHOL/HDL Ratio: 6
Triglycerides: 331 mg/dL — ABNORMAL HIGH (ref 0.0–149.0)
VLDL: 66.2 mg/dL — ABNORMAL HIGH (ref 0.0–40.0)

## 2022-05-21 LAB — HEMOGLOBIN A1C: Hgb A1c MFr Bld: 5 % (ref 4.6–6.5)

## 2022-05-21 NOTE — Progress Notes (Signed)
Jesse Houston 42 y.o.   Chief Complaint  Patient presents with   Annual Exam    No concerns    HISTORY OF PRESENT ILLNESS: This is a 42 y.o. male here for annual exam. Healthy male with a healthy lifestyle. Has no complaints or medical concerns today.  HPI   Prior to Admission medications   Medication Sig Start Date End Date Taking? Authorizing Provider  Multiple Vitamin (MULTIVITAMIN) tablet Take 1 tablet by mouth daily.   Yes [provider]  Omega-3 Fatty Acids (FISH OIL) 1000 MG CAPS Take 2 capsules by mouth daily.   Yes [provider]  atorvastatin (LIPITOR) 20 MG tablet Take 1 tablet (20 mg total) by mouth daily. Patient not taking: Reported on 05/17/2021 05/19/19   Maximiano Coss, NP    No Known Allergies  Patient Active Problem List   Diagnosis Date Noted   History of diverticulitis 05/07/2018    Past Medical History:  Diagnosis Date   Allergy    Asthma    Diverticulosis    Diverticulosis    Low testosterone     Past Surgical History:  Procedure Laterality Date   COLONOSCOPY N/A 11/06/2017   Procedure: COLONOSCOPY;  Surgeon: Leighton Ruff, MD;  Location: [facility];  Service: Endoscopy;  Laterality: N/A;   COLONOSCOPY  11/06/2017   tics    VASECTOMY     WISDOM TOOTH EXTRACTION      Social History   Socioeconomic History   Marital status: Married    Spouse name: Not on file   Number of children: 1   Years of education: Not on file   Highest education level: Not on file  Occupational History   Occupation: Superintendent  Tobacco Use   Smoking status: Never   Smokeless tobacco: Never  Vaping Use   Vaping Use: Never used  Substance and Sexual Activity   Alcohol use: Yes    Alcohol/week: 2.0 - 3.0 standard drinks of alcohol    Types: 2 - 3 Standard drinks or equivalent per week    Comment: social    Drug use: No   Sexual activity: Yes    Birth control/protection: Condom  Other Topics Concern   Not on file  Social  History Narrative   Not on file   Social Determinants of Health   Financial Resource Strain: Not on file  Food Insecurity: Not on file  Transportation Needs: Not on file  Physical Activity: Not on file  Stress: Not on file  Social Connections: Not on file  Intimate Partner Violence: Not on file    Family History  Problem Relation Age of Onset   Arthritis Mother    Brain cancer Son    Arthritis Maternal Grandmother    Diabetes Maternal Grandfather    Uterine cancer Paternal Grandmother    Cirrhosis Paternal Grandfather    Diabetes Maternal Aunt    Colon cancer Neg Hx    Esophageal cancer Neg Hx    Inflammatory bowel disease Neg Hx    Liver disease Neg Hx    Pancreatic cancer Neg Hx    Rectal cancer Neg Hx    Stomach cancer Neg Hx    Colon polyps Neg Hx      Review of Systems  Constitutional: Negative.  Negative for chills and fever.  HENT: Negative.  Negative for congestion and sore throat.   Respiratory: Negative.  Negative for cough and shortness of breath.   Cardiovascular: Negative.  Negative for chest pain and palpitations.  Gastrointestinal: Negative.  Negative for abdominal pain, blood in stool, diarrhea, melena, nausea and vomiting.  Genitourinary: Negative.  Negative for dysuria and hematuria.  Skin: Negative.  Negative for rash.  Neurological: Negative.  Negative for dizziness and headaches.  All other systems reviewed and are negative.  Today's Vitals   05/21/22 1432  BP: 120/74  Pulse: 69  Temp: 98.4 F (36.9 C)  TempSrc: Oral  SpO2: 93%  Weight: 197 lb 2 oz (89.4 kg)  Height: '6\' 3"'$  (1.905 m)   Body mass index is 24.64 kg/m.   Physical Exam Vitals reviewed.  Constitutional:      Appearance: Normal appearance.  HENT:     Head: Normocephalic.     Right Ear: Tympanic membrane, ear canal and external ear normal.     Left Ear: Tympanic membrane, ear canal and external ear normal.     Mouth/Throat:     Mouth: Mucous membranes are moist.      Pharynx: Oropharynx is clear.  Eyes:     Extraocular Movements: Extraocular movements intact.     Conjunctiva/sclera: Conjunctivae normal.     Pupils: Pupils are equal, round, and reactive to light.  Cardiovascular:     Rate and Rhythm: Normal rate and regular rhythm.     Pulses: Normal pulses.     Heart sounds: Normal heart sounds.  Pulmonary:     Effort: Pulmonary effort is normal.     Breath sounds: Normal breath sounds.  Abdominal:     General: There is no distension.     Palpations: Abdomen is soft.     Tenderness: There is no abdominal tenderness.  Musculoskeletal:     Cervical back: No tenderness.     Right lower leg: No edema.     Left lower leg: No edema.  Lymphadenopathy:     Cervical: No cervical adenopathy.  Skin:    General: Skin is warm and dry.     Capillary Refill: Capillary refill takes less than 2 seconds.  Neurological:     General: No focal deficit present.     Mental Status: He is alert and oriented to person, place, and time.  Psychiatric:        Mood and Affect: Mood normal.        Behavior: Behavior normal.      ASSESSMENT & PLAN: Problem List Items Addressed This Visit   None Visit Diagnoses     Routine general medical examination at a health care facility    -  Primary   Relevant Orders   CBC with Differential   Comprehensive metabolic panel   Hemoglobin A1c   Lipid panel   Hepatitis C antibody screen   Need for hepatitis C screening test       Relevant Orders   Hepatitis C antibody screen   Screening for deficiency anemia       Relevant Orders   CBC with Differential   Screening for lipoid disorders       Relevant Orders   Lipid panel   Screening for endocrine, metabolic and immunity disorder       Relevant Orders   Comprehensive metabolic panel   Hemoglobin A1c   Hepatitis C antibody screen      Modifiable risk factors discussed with patient. Anticipatory guidance according to age provided. The following topics were also  discussed: Social Determinants of Health Smoking.  Non-smoker Diet and nutrition.  Good eating habits Benefits of exercise.  Exercises frequently Cancer family history review Vaccinations review  and recommendations Cardiovascular risk assessment and need for blood work The 10-year ASCVD risk score (Arnett DK, et al., 2019) is: 2.5%   Values used to calculate the score:     Age: 26 years     Sex: Male     Is Non-Hispanic African American: No     Diabetic: No     Tobacco smoker: No     Systolic Blood Pressure: 123456 mmHg     Is BP treated: No     HDL Cholesterol: 31.6 mg/dL     Total Cholesterol: 203 mg/dL  Mental health including depression and anxiety Fall and accident prevention  Patient Instructions  Health Maintenance, Male Adopting a healthy lifestyle and getting preventive care are important in promoting health and wellness. Ask your health care provider about: The right schedule for you to have regular tests and exams. Things you can do on your own to prevent diseases and keep yourself healthy. What should I know about diet, weight, and exercise? Eat a healthy diet  Eat a diet that includes plenty of vegetables, fruits, low-fat dairy products, and lean protein. Do not eat a lot of foods that are high in solid fats, added sugars, or sodium. Maintain a healthy weight Body mass index (BMI) is a measurement that can be used to identify possible weight problems. It estimates body fat based on height and weight. Your health care provider can help determine your BMI and help you achieve or maintain a healthy weight. Get regular exercise Get regular exercise. This is one of the most important things you can do for your health. Most adults should: Exercise for at least 150 minutes each week. The exercise should increase your heart rate and make you sweat (moderate-intensity exercise). Do strengthening exercises at least twice a week. This is in addition to the moderate-intensity  exercise. Spend less time sitting. Even light physical activity can be beneficial. Watch cholesterol and blood lipids Have your blood tested for lipids and cholesterol at 42 years of age, then have this test every 5 years. You may need to have your cholesterol levels checked more often if: Your lipid or cholesterol levels are high. You are older than 42 years of age. You are at high risk for heart disease. What should I know about cancer screening? Many types of cancers can be detected early and may often be prevented. Depending on your health history and family history, you may need to have cancer screening at various ages. This may include screening for: Colorectal cancer. Prostate cancer. Skin cancer. Lung cancer. What should I know about heart disease, diabetes, and high blood pressure? Blood pressure and heart disease High blood pressure causes heart disease and increases the risk of stroke. This is more likely to develop in people who have high blood pressure readings or are overweight. Talk with your health care provider about your target blood pressure readings. Have your blood pressure checked: Every 3-5 years if you are 50-67 years of age. Every year if you are 78 years old or older. If you are between the ages of 76 and 30 and are a current or former smoker, ask your health care provider if you should have a one-time screening for abdominal aortic aneurysm (AAA). Diabetes Have regular diabetes screenings. This checks your fasting blood sugar level. Have the screening done: Once every three years after age 52 if you are at a normal weight and have a low risk for diabetes. More often and at a younger age if  you are overweight or have a high risk for diabetes. What should I know about preventing infection? Hepatitis B If you have a higher risk for hepatitis B, you should be screened for this virus. Talk with your health care provider to find out if you are at risk for hepatitis B  infection. Hepatitis C Blood testing is recommended for: Everyone born from [birth year] through 1965. Anyone with known risk factors for hepatitis C. Sexually transmitted infections (STIs) You should be screened each year for STIs, including gonorrhea and chlamydia, if: You are sexually active and are younger than 42 years of age. You are older than 42 years of age and your health care provider tells you that you are at risk for this type of infection. Your sexual activity has changed since you were last screened, and you are at increased risk for chlamydia or gonorrhea. Ask your health care provider if you are at risk. Ask your health care provider about whether you are at high risk for HIV. Your health care provider may recommend a prescription medicine to help prevent HIV infection. If you choose to take medicine to prevent HIV, you should first get tested for HIV. You should then be tested every 3 months for as long as you are taking the medicine. Follow these instructions at home: Alcohol use Do not drink alcohol if your health care provider tells you not to drink. If you drink alcohol: Limit how much you have to 0-2 drinks a day. Know how much alcohol is in your drink. In the [place] one drink equals one 12 oz bottle of beer (355 mL), one 5 oz glass of wine (148 mL), or one 1 oz glass of hard liquor (44 mL). Lifestyle Do not use any products that contain nicotine or tobacco. These products include cigarettes, chewing tobacco, and vaping devices, such as e-cigarettes. If you need help quitting, ask your health care provider. Do not use street drugs. Do not share needles. Ask your health care provider for help if you need support or information about quitting drugs. General instructions Schedule regular health, dental, and eye exams. Stay current with your vaccines. Tell your health care provider if: You often feel depressed. You have ever been abused or do not feel safe at  home. Summary Adopting a healthy lifestyle and getting preventive care are important in promoting health and wellness. Follow your health care provider's instructions about healthy diet, exercising, and getting tested or screened for diseases. Follow your health care provider's instructions on monitoring your cholesterol and blood pressure. This information is not intended to replace advice given to you by your health care provider. Make sure you discuss any questions you have with your health care provider. Document Revised: 07/24/2020 Document Reviewed: 07/24/2020 Elsevier Patient Education  [place], MD [facility]

## 2022-05-21 NOTE — Patient Instructions (Signed)
Health Maintenance, Male Adopting a healthy lifestyle and getting preventive care are important in promoting health and wellness. Ask your health care provider about: The right schedule for you to have regular tests and exams. Things you can do on your own to prevent diseases and keep yourself healthy. What should I know about diet, weight, and exercise? Eat a healthy diet  Eat a diet that includes plenty of vegetables, fruits, low-fat dairy products, and lean protein. Do not eat a lot of foods that are high in solid fats, added sugars, or sodium. Maintain a healthy weight Body mass index (BMI) is a measurement that can be used to identify possible weight problems. It estimates body fat based on height and weight. Your health care provider can help determine your BMI and help you achieve or maintain a healthy weight. Get regular exercise Get regular exercise. This is one of the most important things you can do for your health. Most adults should: Exercise for at least 150 minutes each week. The exercise should increase your heart rate and make you sweat (moderate-intensity exercise). Do strengthening exercises at least twice a week. This is in addition to the moderate-intensity exercise. Spend less time sitting. Even light physical activity can be beneficial. Watch cholesterol and blood lipids Have your blood tested for lipids and cholesterol at 42 years of age, then have this test every 5 years. You may need to have your cholesterol levels checked more often if: Your lipid or cholesterol levels are high. You are older than 42 years of age. You are at high risk for heart disease. What should I know about cancer screening? Many types of cancers can be detected early and may often be prevented. Depending on your health history and family history, you may need to have cancer screening at various ages. This may include screening for: Colorectal cancer. Prostate cancer. Skin cancer. Lung  cancer. What should I know about heart disease, diabetes, and high blood pressure? Blood pressure and heart disease High blood pressure causes heart disease and increases the risk of stroke. This is more likely to develop in people who have high blood pressure readings or are overweight. Talk with your health care provider about your target blood pressure readings. Have your blood pressure checked: Every 3-5 years if you are 18-39 years of age. Every year if you are 40 years old or older. If you are between the ages of 65 and 75 and are a current or former smoker, ask your health care provider if you should have a one-time screening for abdominal aortic aneurysm (AAA). Diabetes Have regular diabetes screenings. This checks your fasting blood sugar level. Have the screening done: Once every three years after age 45 if you are at a normal weight and have a low risk for diabetes. More often and at a younger age if you are overweight or have a high risk for diabetes. What should I know about preventing infection? Hepatitis B If you have a higher risk for hepatitis B, you should be screened for this virus. Talk with your health care provider to find out if you are at risk for hepatitis B infection. Hepatitis C Blood testing is recommended for: Everyone born from 1945 through 1965. Anyone with known risk factors for hepatitis C. Sexually transmitted infections (STIs) You should be screened each year for STIs, including gonorrhea and chlamydia, if: You are sexually active and are younger than 42 years of age. You are older than 42 years of age and your   health care provider tells you that you are at risk for this type of infection. Your sexual activity has changed since you were last screened, and you are at increased risk for chlamydia or gonorrhea. Ask your health care provider if you are at risk. Ask your health care provider about whether you are at high risk for HIV. Your health care provider  may recommend a prescription medicine to help prevent HIV infection. If you choose to take medicine to prevent HIV, you should first get tested for HIV. You should then be tested every 3 months for as long as you are taking the medicine. Follow these instructions at home: Alcohol use Do not drink alcohol if your health care provider tells you not to drink. If you drink alcohol: Limit how much you have to 0-2 drinks a day. Know how much alcohol is in your drink. In the [place] one drink equals one 12 oz bottle of beer (355 mL), one 5 oz glass of wine (148 mL), or one 1 oz glass of hard liquor (44 mL). Lifestyle Do not use any products that contain nicotine or tobacco. These products include cigarettes, chewing tobacco, and vaping devices, such as e-cigarettes. If you need help quitting, ask your health care provider. Do not use street drugs. Do not share needles. Ask your health care provider for help if you need support or information about quitting drugs. General instructions Schedule regular health, dental, and eye exams. Stay current with your vaccines. Tell your health care provider if: You often feel depressed. You have ever been abused or do not feel safe at home. Summary Adopting a healthy lifestyle and getting preventive care are important in promoting health and wellness. Follow your health care provider's instructions about healthy diet, exercising, and getting tested or screened for diseases. Follow your health care provider's instructions on monitoring your cholesterol and blood pressure. This information is not intended to replace advice given to you by your health care provider. Make sure you discuss any questions you have with your health care provider. Document Revised: 07/24/2020 Document Reviewed: 07/24/2020 Elsevier Patient Education  2023 Elsevier Inc.  

## 2022-05-22 LAB — LDL CHOLESTEROL, DIRECT: Direct LDL: 129 mg/dL

## 2022-05-22 LAB — HEPATITIS C ANTIBODY: Hepatitis C Ab: NONREACTIVE

## 2022-05-24 ENCOUNTER — Telehealth: Payer: Self-pay | Admitting: Emergency Medicine

## 2022-05-24 NOTE — Telephone Encounter (Signed)
Forms received by CMA, placed in provider office.

## 2022-05-24 NOTE — Telephone Encounter (Signed)
Patient dropped off document  Physician Verification Form , to be filled out by provider. Patient requested to send it via Call Patient to pick up within 7-days. Document is located in providers tray at front office.Please advise at Mobile [phone] (mobile)

## 2022-05-27 NOTE — Telephone Encounter (Signed)
Called patient and left message for patient to pick up form at front desk

## 2023-05-22 ENCOUNTER — Ambulatory Visit: Payer: BC Managed Care – PPO | Admitting: Emergency Medicine

## 2023-05-22 ENCOUNTER — Encounter: Payer: Self-pay | Admitting: Emergency Medicine

## 2023-05-22 VITALS — BP 108/78 | HR 68 | Temp 98.4°F | Ht 75.0 in | Wt 198.0 lb

## 2023-05-22 DIAGNOSIS — Z13228 Encounter for screening for other metabolic disorders: Secondary | ICD-10-CM

## 2023-05-22 DIAGNOSIS — Z13220 Encounter for screening for lipoid disorders: Secondary | ICD-10-CM

## 2023-05-22 DIAGNOSIS — Z1329 Encounter for screening for other suspected endocrine disorder: Secondary | ICD-10-CM

## 2023-05-22 DIAGNOSIS — Z130 Encounter for screening for diseases of the blood and blood-forming organs and certain disorders involving the immune mechanism: Secondary | ICD-10-CM

## 2023-05-22 DIAGNOSIS — Z0000 Encounter for general adult medical examination without abnormal findings: Secondary | ICD-10-CM

## 2023-05-22 LAB — LIPID PANEL
Cholesterol: 210 mg/dL — ABNORMAL HIGH (ref 0–200)
HDL: 38.7 mg/dL — ABNORMAL LOW (ref >39.00)
LDL Cholesterol: 129 mg/dL — ABNORMAL HIGH (ref 0–99)
NonHDL: 171.52
Total CHOL/HDL Ratio: 5
Triglycerides: 214 mg/dL — ABNORMAL HIGH (ref 0.0–149.0)
VLDL: 42.8 mg/dL — ABNORMAL HIGH (ref 0.0–40.0)

## 2023-05-22 LAB — HEMOGLOBIN A1C: Hgb A1c MFr Bld: 5.2 % (ref 4.6–6.5)

## 2023-05-22 LAB — CBC WITH DIFFERENTIAL/PLATELET
Basophils Absolute: 0.1 10*3/uL (ref 0.0–0.1)
Basophils Relative: 0.6 % (ref 0.0–3.0)
Eosinophils Absolute: 0.2 10*3/uL (ref 0.0–0.7)
Eosinophils Relative: 2.8 % (ref 0.0–5.0)
HCT: 45.2 % (ref 39.0–52.0)
Hemoglobin: 15.2 g/dL (ref 13.0–17.0)
Lymphocytes Relative: 27 % (ref 12.0–46.0)
Lymphs Abs: 2.4 10*3/uL (ref 0.7–4.0)
MCHC: 33.6 g/dL (ref 30.0–36.0)
MCV: 86.7 fl (ref 78.0–100.0)
Monocytes Absolute: 0.7 10*3/uL (ref 0.1–1.0)
Monocytes Relative: 7.5 % (ref 3.0–12.0)
Neutro Abs: 5.6 10*3/uL (ref 1.4–7.7)
Neutrophils Relative %: 62.1 % (ref 43.0–77.0)
Platelets: 353 10*3/uL (ref 150.0–400.0)
RBC: 5.21 Mil/uL (ref 4.22–5.81)
RDW: 12.7 % (ref 11.5–15.5)
WBC: 9 10*3/uL (ref 4.0–10.5)

## 2023-05-22 LAB — COMPREHENSIVE METABOLIC PANEL
ALT: 22 U/L (ref 0–53)
AST: 23 U/L (ref 0–37)
Albumin: 4.6 g/dL (ref 3.5–5.2)
Alkaline Phosphatase: 81 U/L (ref 39–117)
BUN: 15 mg/dL (ref 6–23)
CO2: 27 meq/L (ref 19–32)
Calcium: 9.8 mg/dL (ref 8.4–10.5)
Chloride: 101 meq/L (ref 96–112)
Creatinine, Ser: 0.9 mg/dL (ref 0.40–1.50)
GFR: 104.92 mL/min (ref >60.00)
Glucose, Bld: 95 mg/dL (ref 70–99)
Potassium: 4.1 meq/L (ref 3.5–5.1)
Sodium: 136 meq/L (ref 135–145)
Total Bilirubin: 0.7 mg/dL (ref 0.2–1.2)
Total Protein: 7.5 g/dL (ref 6.0–8.3)

## 2023-05-22 LAB — TESTOSTERONE: Testosterone: 357.08 ng/dL (ref 300.00–890.00)

## 2023-05-22 NOTE — Patient Instructions (Signed)
 Health Maintenance, Male  Adopting a healthy lifestyle and getting preventive care are important in promoting health and wellness. Ask your health care provider about:  The right schedule for you to have regular tests and exams.  Things you can do on your own to prevent diseases and keep yourself healthy.  What should I know about diet, weight, and exercise?  Eat a healthy diet    Eat a diet that includes plenty of vegetables, fruits, low-fat dairy products, and lean protein.  Do not eat a lot of foods that are high in solid fats, added sugars, or sodium.  Maintain a healthy weight  Body mass index (BMI) is a measurement that can be used to identify possible weight problems. It estimates body fat based on height and weight. Your health care provider can help determine your BMI and help you achieve or maintain a healthy weight.  Get regular exercise  Get regular exercise. This is one of the most important things you can do for your health. Most adults should:  Exercise for at least 150 minutes each week. The exercise should increase your heart rate and make you sweat (moderate-intensity exercise).  Do strengthening exercises at least twice a week. This is in addition to the moderate-intensity exercise.  Spend less time sitting. Even light physical activity can be beneficial.  Watch cholesterol and blood lipids  Have your blood tested for lipids and cholesterol at 43 years of age, then have this test every 5 years.  You may need to have your cholesterol levels checked more often if:  Your lipid or cholesterol levels are high.  You are older than 43 years of age.  You are at high risk for heart disease.  What should I know about cancer screening?  Many types of cancers can be detected early and may often be prevented. Depending on your health history and family history, you may need to have cancer screening at various ages. This may include screening for:  Colorectal cancer.  Prostate cancer.  Skin cancer.  Lung  cancer.  What should I know about heart disease, diabetes, and high blood pressure?  Blood pressure and heart disease  High blood pressure causes heart disease and increases the risk of stroke. This is more likely to develop in people who have high blood pressure readings or are overweight.  Talk with your health care provider about your target blood pressure readings.  Have your blood pressure checked:  Every 3-5 years if you are 9-95 years of age.  Every year if you are 85 years old or older.  If you are between the ages of 29 and 29 and are a current or former smoker, ask your health care provider if you should have a one-time screening for abdominal aortic aneurysm (AAA).  Diabetes  Have regular diabetes screenings. This checks your fasting blood sugar level. Have the screening done:  Once every three years after age 23 if you are at a normal weight and have a low risk for diabetes.  More often and at a younger age if you are overweight or have a high risk for diabetes.  What should I know about preventing infection?  Hepatitis B  If you have a higher risk for hepatitis B, you should be screened for this virus. Talk with your health care provider to find out if you are at risk for hepatitis B infection.  Hepatitis C  Blood testing is recommended for:  Everyone born from [birth year] through 1965.  Anyone  with known risk factors for hepatitis C.  Sexually transmitted infections (STIs)  You should be screened each year for STIs, including gonorrhea and chlamydia, if:  You are sexually active and are younger than 43 years of age.  You are older than 43 years of age and your health care provider tells you that you are at risk for this type of infection.  Your sexual activity has changed since you were last screened, and you are at increased risk for chlamydia or gonorrhea. Ask your health care provider if you are at risk.  Ask your health care provider about whether you are at high risk for HIV. Your health care provider  may recommend a prescription medicine to help prevent HIV infection. If you choose to take medicine to prevent HIV, you should first get tested for HIV. You should then be tested every 3 months for as long as you are taking the medicine.  Follow these instructions at home:  Alcohol use  Do not drink alcohol if your health care provider tells you not to drink.  If you drink alcohol:  Limit how much you have to 0-2 drinks a day.  Know how much alcohol is in your drink. In the [place] one drink equals one 12 oz bottle of beer (355 mL), one 5 oz glass of wine (148 mL), or one 1 oz glass of hard liquor (44 mL).  Lifestyle  Do not use any products that contain nicotine or tobacco. These products include cigarettes, chewing tobacco, and vaping devices, such as e-cigarettes. If you need help quitting, ask your health care provider.  Do not use street drugs.  Do not share needles.  Ask your health care provider for help if you need support or information about quitting drugs.  General instructions  Schedule regular health, dental, and eye exams.  Stay current with your vaccines.  Tell your health care provider if:  You often feel depressed.  You have ever been abused or do not feel safe at home.  Summary  Adopting a healthy lifestyle and getting preventive care are important in promoting health and wellness.  Follow your health care provider's instructions about healthy diet, exercising, and getting tested or screened for diseases.  Follow your health care provider's instructions on monitoring your cholesterol and blood pressure.  This information is not intended to replace advice given to you by your health care provider. Make sure you discuss any questions you have with your health care provider.  Document Revised: 07/24/2020 Document Reviewed: 07/24/2020  Elsevier Patient Education  2024 [employer].

## 2023-05-22 NOTE — Progress Notes (Signed)
 Jesse Houston 43 y.o.   Chief Complaint  Patient presents with   Annual Exam    Patient here for physical. No other concerns     HISTORY OF PRESENT ILLNESS: This is a 42 y.o. male A1A  here for annual exam. Healthy male with a healthy lifestyle Concerned about lack of energy and getting tired easily when exercising. Worried about his testosterone levels. No other complaints or medical concerns today.  HPI   Prior to Admission medications   Medication Sig Start Date End Date Taking? Authorizing Provider  Multiple Vitamin (MULTIVITAMIN) tablet Take 1 tablet by mouth daily.   Yes [provider]  Omega-3 Fatty Acids (FISH OIL) 1000 MG CAPS Take 2 capsules by mouth daily.   Yes [provider]  atorvastatin (LIPITOR) 20 MG tablet Take 1 tablet (20 mg total) by mouth daily. Patient not taking: Reported on 05/22/2023 05/19/19   Janeece Agee, NP    No Known Allergies  Patient Active Problem List   Diagnosis Date Noted   History of diverticulitis 05/07/2018    Past Medical History:  Diagnosis Date   Allergy    Asthma    Diverticulosis    Diverticulosis    Low testosterone     Past Surgical History:  Procedure Laterality Date   COLONOSCOPY N/A 11/06/2017   Procedure: COLONOSCOPY;  Surgeon: Romie Levee, MD;  Location: [facility];  Service: Endoscopy;  Laterality: N/A;   COLONOSCOPY  11/06/2017   tics    VASECTOMY     WISDOM TOOTH EXTRACTION      Social History   Socioeconomic History   Marital status: Married    Spouse name: Not on file   Number of children: 1   Years of education: Not on file   Highest education level: Not on file  Occupational History   Occupation: Superintendent  Tobacco Use   Smoking status: Never   Smokeless tobacco: Never  Vaping Use   Vaping status: Never Used  Substance and Sexual Activity   Alcohol use: Yes    Alcohol/week: 2.0 - 3.0 standard drinks of alcohol    Types: 2 - 3 Standard drinks or equivalent per  week    Comment: social    Drug use: No   Sexual activity: Yes    Birth control/protection: Condom  Other Topics Concern   Not on file  Social History Narrative   Not on file   Social Drivers of Health   Financial Resource Strain: Not on file  Food Insecurity: Not on file  Transportation Needs: Not on file  Physical Activity: Not on file  Stress: Not on file  Social Connections: Not on file  Intimate Partner Violence: Not on file    Family History  Problem Relation Age of Onset   Arthritis Mother    Brain cancer Son    Arthritis Maternal Grandmother    Diabetes Maternal Grandfather    Uterine cancer Paternal Grandmother    Cirrhosis Paternal Grandfather    Diabetes Maternal Aunt    Colon cancer Neg Hx    Esophageal cancer Neg Hx    Inflammatory bowel disease Neg Hx    Liver disease Neg Hx    Pancreatic cancer Neg Hx    Rectal cancer Neg Hx    Stomach cancer Neg Hx    Colon polyps Neg Hx      Review of Systems  Constitutional: Negative.  Negative for chills and fever.  HENT: Negative.  Negative for congestion and sore throat.  Respiratory: Negative.  Negative for cough and shortness of breath.   Cardiovascular: Negative.  Negative for chest pain and palpitations.  Gastrointestinal:  Negative for abdominal pain, diarrhea, nausea and vomiting.  Genitourinary: Negative.  Negative for dysuria and hematuria.  Skin: Negative.  Negative for rash.  Neurological: Negative.  Negative for dizziness and headaches.  All other systems reviewed and are negative.   Vitals:   05/22/23 0815  BP: 108/78  Pulse: 68  Temp: 98.4 F (36.9 C)  SpO2: 98%    Physical Exam Vitals reviewed.  Constitutional:      Appearance: Normal appearance.  HENT:     Head: Normocephalic.     Right Ear: Tympanic membrane, ear canal and external ear normal.     Left Ear: Tympanic membrane, ear canal and external ear normal.     Mouth/Throat:     Mouth: Mucous membranes are moist.      Pharynx: Oropharynx is clear.  Eyes:     Extraocular Movements: Extraocular movements intact.     Conjunctiva/sclera: Conjunctivae normal.     Pupils: Pupils are equal, round, and reactive to light.  Cardiovascular:     Rate and Rhythm: Normal rate and regular rhythm.     Pulses: Normal pulses.     Heart sounds: Normal heart sounds.  Pulmonary:     Effort: Pulmonary effort is normal.     Breath sounds: Normal breath sounds.  Abdominal:     Palpations: Abdomen is soft.     Tenderness: There is no abdominal tenderness.  Musculoskeletal:     Cervical back: No tenderness.  Lymphadenopathy:     Cervical: No cervical adenopathy.  Skin:    General: Skin is warm and dry.     Capillary Refill: Capillary refill takes less than 2 seconds.  Neurological:     General: No focal deficit present.     Mental Status: He is alert and oriented to person, place, and time.  Psychiatric:        Mood and Affect: Mood normal.        Behavior: Behavior normal.      ASSESSMENT & PLAN: Problem List Items Addressed This Visit   None Visit Diagnoses       Routine general medical examination at a health care facility    -  Primary   Relevant Orders   CBC with Differential/Platelet   Comprehensive metabolic panel   Hemoglobin A1c   Lipid panel   Testosterone     Screening for deficiency anemia       Relevant Orders   CBC with Differential/Platelet     Screening for lipoid disorders       Relevant Orders   Lipid panel     Screening for endocrine, metabolic and immunity disorder       Relevant Orders   Comprehensive metabolic panel   Hemoglobin A1c   Testosterone      Modifiable risk factors discussed with patient. Anticipatory guidance according to age provided. The following topics were also discussed: Social Determinants of Health Smoking.  Non-smoker Diet and nutrition Benefits of exercise Cancer family history review Vaccinations review and recommendations Cardiovascular risk  assessment and need for blood work Mental health including depression and anxiety Fall and accident prevention  Patient Instructions  Health Maintenance, Male Adopting a healthy lifestyle and getting preventive care are important in promoting health and wellness. Ask your health care provider about: The right schedule for you to have regular tests and exams. Things you  can do on your own to prevent diseases and keep yourself healthy. What should I know about diet, weight, and exercise? Eat a healthy diet  Eat a diet that includes plenty of vegetables, fruits, low-fat dairy products, and lean protein. Do not eat a lot of foods that are high in solid fats, added sugars, or sodium. Maintain a healthy weight Body mass index (BMI) is a measurement that can be used to identify possible weight problems. It estimates body fat based on height and weight. Your health care provider can help determine your BMI and help you achieve or maintain a healthy weight. Get regular exercise Get regular exercise. This is one of the most important things you can do for your health. Most adults should: Exercise for at least 150 minutes each week. The exercise should increase your heart rate and make you sweat (moderate-intensity exercise). Do strengthening exercises at least twice a week. This is in addition to the moderate-intensity exercise. Spend less time sitting. Even light physical activity can be beneficial. Watch cholesterol and blood lipids Have your blood tested for lipids and cholesterol at 43 years of age, then have this test every 5 years. You may need to have your cholesterol levels checked more often if: Your lipid or cholesterol levels are high. You are older than 43 years of age. You are at high risk for heart disease. What should I know about cancer screening? Many types of cancers can be detected early and may often be prevented. Depending on your health history and family history, you may need  to have cancer screening at various ages. This may include screening for: Colorectal cancer. Prostate cancer. Skin cancer. Lung cancer. What should I know about heart disease, diabetes, and high blood pressure? Blood pressure and heart disease High blood pressure causes heart disease and increases the risk of stroke. This is more likely to develop in people who have high blood pressure readings or are overweight. Talk with your health care provider about your target blood pressure readings. Have your blood pressure checked: Every 3-5 years if you are 41-25 years of age. Every year if you are 52 years old or older. If you are between the ages of 13 and 3 and are a current or former smoker, ask your health care provider if you should have a one-time screening for abdominal aortic aneurysm (AAA). Diabetes Have regular diabetes screenings. This checks your fasting blood sugar level. Have the screening done: Once every three years after age 44 if you are at a normal weight and have a low risk for diabetes. More often and at a younger age if you are overweight or have a high risk for diabetes. What should I know about preventing infection? Hepatitis B If you have a higher risk for hepatitis B, you should be screened for this virus. Talk with your health care provider to find out if you are at risk for hepatitis B infection. Hepatitis C Blood testing is recommended for: Everyone born from [birth year] through 1965. Anyone with known risk factors for hepatitis C. Sexually transmitted infections (STIs) You should be screened each year for STIs, including gonorrhea and chlamydia, if: You are sexually active and are younger than 43 years of age. You are older than 43 years of age and your health care provider tells you that you are at risk for this type of infection. Your sexual activity has changed since you were last screened, and you are at increased risk for chlamydia or gonorrhea. Ask your  health care  provider if you are at risk. Ask your health care provider about whether you are at high risk for HIV. Your health care provider may recommend a prescription medicine to help prevent HIV infection. If you choose to take medicine to prevent HIV, you should first get tested for HIV. You should then be tested every 3 months for as long as you are taking the medicine. Follow these instructions at home: Alcohol use Do not drink alcohol if your health care provider tells you not to drink. If you drink alcohol: Limit how much you have to 0-2 drinks a day. Know how much alcohol is in your drink. In the [place] one drink equals one 12 oz bottle of beer (355 mL), one 5 oz glass of wine (148 mL), or one 1 oz glass of hard liquor (44 mL). Lifestyle Do not use any products that contain nicotine or tobacco. These products include cigarettes, chewing tobacco, and vaping devices, such as e-cigarettes. If you need help quitting, ask your health care provider. Do not use street drugs. Do not share needles. Ask your health care provider for help if you need support or information about quitting drugs. General instructions Schedule regular health, dental, and eye exams. Stay current with your vaccines. Tell your health care provider if: You often feel depressed. You have ever been abused or do not feel safe at home. Summary Adopting a healthy lifestyle and getting preventive care are important in promoting health and wellness. Follow your health care provider's instructions about healthy diet, exercising, and getting tested or screened for diseases. Follow your health care provider's instructions on monitoring your cholesterol and blood pressure. This information is not intended to replace advice given to you by your health care provider. Make sure you discuss any questions you have with your health care provider. Document Revised: 07/24/2020 Document Reviewed: 07/24/2020 Elsevier Patient Education  2024  [employer]     Edwina Barth, MD [facility]

## 2023-07-02 DIAGNOSIS — R202 Paresthesia of skin: Secondary | ICD-10-CM | POA: Diagnosis not present

## 2024-03-18 ENCOUNTER — Encounter (HOSPITAL_COMMUNITY): Payer: Self-pay

## 2024-03-18 ENCOUNTER — Ambulatory Visit (HOSPITAL_COMMUNITY)
Admission: EM | Admit: 2024-03-18 | Discharge: 2024-03-18 | Disposition: A | Discharge status: Home / Self Care | Attending: Emergency Medicine | Admitting: Emergency Medicine

## 2024-03-18 DIAGNOSIS — B029 Zoster without complications: Secondary | ICD-10-CM | POA: Diagnosis not present

## 2024-03-18 MED ORDER — VALACYCLOVIR HCL 1 G PO TABS: 1000.0000 mg | ORAL_TABLET | Freq: Three times a day (TID) | ORAL | 0 refills | Status: AC

## 2024-03-18 MED ORDER — GABAPENTIN 300 MG PO CAPS: 300.0000 mg | ORAL_CAPSULE | Freq: Three times a day (TID) | ORAL | 0 refills | Status: AC | PRN

## 2024-03-18 NOTE — ED Triage Notes (Signed)
 Patient reports that he began having abdominal pain a week ago and then a rash to the same area 2 days ago.  Patient denies taking any medications for his symptoms.

## 2024-03-18 NOTE — ED Provider Notes (Signed)
 "    [facility]    CSN: [id number] Arrival date & time: 03/18/24  9147    HISTORY   Chief Complaint  Patient presents with   Rash   HPI Jesse Houston is a pleasant, 44 y.o. male who presents to urgent care today. Patient states that for the past week he has been having superficial pain on the left side of his abdomen and his left flank.  States that 2 days ago, a rash appeared in the same area where he has been having pain.  Patient states he had chickenpox when he was a child.  Patient states he has never had shingles.  Patient states has not tried any thing to alleviate his symptoms.  Patient states he has had some discomfort at night while trying to sleep as he typically sleeps on his stomach.  Denies fever, body aches, chills, known sick contacts.  The history is provided by the patient.  Rash  Past Medical History:  Diagnosis Date   Allergy    Asthma    Diverticulosis    Diverticulosis    Low testosterone     Patient Active Problem List   Diagnosis Date Noted   History of diverticulitis 05/07/2018   Past Surgical History:  Procedure Laterality Date   COLONOSCOPY N/A 11/06/2017   Procedure: COLONOSCOPY;  Surgeon: Debby Hila, MD;  Location: [facility];  Service: Endoscopy;  Laterality: N/A;   COLONOSCOPY  11/06/2017   tics    VASECTOMY     WISDOM TOOTH EXTRACTION      Home Medications    Prior to Admission medications  Medication Sig Start Date End Date Taking? Authorizing Provider  atorvastatin  (LIPITOR) 20 MG tablet Take 1 tablet (20 mg total) by mouth daily. Patient not taking: Reported on 05/22/2023 05/19/19   Kip Ade, NP  Multiple Vitamin (MULTIVITAMIN) tablet Take 1 tablet by mouth daily.    [provider]  Omega-3 Fatty Acids (FISH OIL) 1000 MG CAPS Take 2 capsules by mouth daily.    [provider]    Family History Family History  Problem Relation Age of Onset   Arthritis Mother    Brain cancer Son    Arthritis  Maternal Grandmother    Diabetes Maternal Grandfather    Uterine cancer Paternal Grandmother    Cirrhosis Paternal Grandfather    Diabetes Maternal Aunt    Colon cancer Neg Hx    Esophageal cancer Neg Hx    Inflammatory bowel disease Neg Hx    Liver disease Neg Hx    Pancreatic cancer Neg Hx    Rectal cancer Neg Hx    Stomach cancer Neg Hx    Colon polyps Neg Hx    Social History Social History[1] Allergies   Patient has no known allergies.  Review of Systems Review of Systems  Skin:  Positive for rash.   Pertinent findings revealed after performing a 14 point review of systems has been noted in the history of present illness.  Physical Exam Vital Signs BP 105/68 (BP Location: Left Arm)   Pulse 65   Temp 98.5 F (36.9 C) (Oral)   Resp 16   SpO2 96%   No data found.  Physical Exam Vitals and nursing note reviewed.  Constitutional:      General: He is awake. He is not in acute distress.    Appearance: Normal appearance. He is well-developed and well-groomed. He is not ill-appearing.  Abdominal:   Neurological:     Mental Status:  He is alert.  Psychiatric:        Behavior: Behavior is cooperative.     Visual Acuity Right Eye Distance:   Left Eye Distance:   Bilateral Distance:    Right Eye Near:   Left Eye Near:    Bilateral Near:     UC Couse / Diagnostics / Procedures:     Radiology No results found.  Procedures Procedures (including critical care time) EKG  Pending results:  Labs Reviewed - No data to display  Medications Ordered in UC: Medications - No data to display  UC Diagnoses / Final Clinical Impressions(s)   I have reviewed the triage vital signs and the nursing notes.  Pertinent labs & imaging results that were available during my care of the patient were reviewed by me and considered in my medical decision making (see chart for details).    Final diagnoses:  Herpes zoster without complication   Patient history and physical  exam findings are consistent with shingles.  Patient provided with a 7-day course of Valtrex for treatment and gabapentin as needed for pain.  Patient education provided in AVS.  Conservative care recommended.  Return precautions advised.  Please see discharge instructions below for details of plan of care as provided to patient. ED Prescriptions     Medication Sig Dispense Auth. Provider   valACYclovir (VALTREX) 1000 MG tablet Take 1 tablet (1,000 mg total) by mouth 3 (three) times daily for 7 days. 21 tablet Joesph Shaver Scales, PA-C   gabapentin (NEURONTIN) 300 MG capsule Take 1 capsule (300 mg total) by mouth 3 (three) times daily as needed. 30 capsule Joesph Shaver Scales, PA-C      PDMP not reviewed this encounter.    Discharge Instructions      For treatment of shingles, please begin valacyclovir 1 tablet 3 times daily.  This will suppress the virus and shorten the course of the outbreak.  For management of pain, I provided you with a prescription for gabapentin which is a nerve pain reliever.  Gabapentin can be taken 3 times daily as needed but, if you find you really only need it at night, you can just take 1 to 2 capsules at night.  You not exceed more than 3 capsules/day.  Thank you for visiting [facility] today.  We appreciate the opportunity to participate in your care.      Disposition Upon Discharge:  Condition: stable for discharge home  Patient presented with an acute illness with associated systemic symptoms and significant discomfort requiring urgent management. In my opinion, this is a condition that a prudent lay person (someone who possesses an average knowledge of health and medicine) may potentially expect to result in complications if not addressed urgently such as respiratory distress, impairment of bodily function or dysfunction of bodily organs.   Routine symptom specific, illness specific and/or disease specific instructions were  discussed with the patient and/or caregiver at length.   As such, the patient has been evaluated and assessed, work-up was performed and treatment was provided in alignment with urgent care protocols and evidence based medicine.  Patient/parent/caregiver has been advised that the patient may require follow up for further testing and treatment if the symptoms continue in spite of treatment, as clinically indicated and appropriate.  Patient/parent/caregiver has been advised to return to the [facility] or PCP if no better; to PCP or the Emergency Department if new signs and symptoms develop, or if the current signs or symptoms continue to change  or worsen for further workup, evaluation and treatment as clinically indicated and appropriate  The patient will follow up with their current PCP if and as advised. If the patient does not currently have a PCP we will assist them in obtaining one.   The patient may need specialty follow up if the symptoms continue, in spite of conservative treatment and management, for further workup, evaluation, consultation and treatment as clinically indicated and appropriate.  Patient/parent/caregiver verbalized understanding and agreement of plan as discussed.  All questions were addressed during visit.  Please see discharge instructions below for further details of plan.  This office note has been dictated using Teaching laboratory technician.  Unfortunately, this method of dictation can sometimes lead to typographical or grammatical errors.  I apologize for your inconvenience in advance if this occurs.  Please do not hesitate to reach out to me if clarification is needed.       [1]  Social History Tobacco Use   Smoking status: Never   Smokeless tobacco: Never  Vaping Use   Vaping status: Never Used  Substance Use Topics   Alcohol use: Yes    Alcohol/week: 2.0 - 3.0 standard drinks of alcohol    Types: 2 - 3 Standard drinks or equivalent per week    Comment: social     Drug use: No     Joesph Shaver Scales, PA-C 03/18/24 1052  "

## 2024-03-18 NOTE — Discharge Instructions (Signed)
 For treatment of shingles, please begin valacyclovir 1 tablet 3 times daily.  This will suppress the virus and shorten the course of the outbreak.  For management of pain, I provided you with a prescription for gabapentin which is a nerve pain reliever.  Gabapentin can be taken 3 times daily as needed but, if you find you really only need it at night, you can just take 1 to 2 capsules at night.  You not exceed more than 3 capsules/day.  Thank you for visiting [facility] today.  We appreciate the opportunity to participate in your care.
